# Patient Record
Sex: Female | Born: 1985 | Race: Black or African American | Hispanic: No | Marital: Single | Smoking: Never smoker
Health system: Southern US, Community
[De-identification: ages and names within clinical notes are randomized; demographics above are authoritative.]

## PROBLEM LIST (undated history)

## (undated) ENCOUNTER — Inpatient Hospital Stay (HOSPITAL_COMMUNITY): Payer: Self-pay

## (undated) ENCOUNTER — Inpatient Hospital Stay (HOSPITAL_COMMUNITY): Payer: Medicare Other

## (undated) DIAGNOSIS — R112 Nausea with vomiting, unspecified: Secondary | ICD-10-CM

## (undated) DIAGNOSIS — T8859XA Other complications of anesthesia, initial encounter: Secondary | ICD-10-CM

## (undated) DIAGNOSIS — R87629 Unspecified abnormal cytological findings in specimens from vagina: Secondary | ICD-10-CM

## (undated) DIAGNOSIS — Z8619 Personal history of other infectious and parasitic diseases: Secondary | ICD-10-CM

## (undated) DIAGNOSIS — R011 Cardiac murmur, unspecified: Secondary | ICD-10-CM

## (undated) DIAGNOSIS — Z9889 Other specified postprocedural states: Secondary | ICD-10-CM

## (undated) DIAGNOSIS — T4145XA Adverse effect of unspecified anesthetic, initial encounter: Secondary | ICD-10-CM

## (undated) DIAGNOSIS — E669 Obesity, unspecified: Secondary | ICD-10-CM

## (undated) DIAGNOSIS — F99 Mental disorder, not otherwise specified: Secondary | ICD-10-CM

## (undated) HISTORY — DX: Unspecified abnormal cytological findings in specimens from vagina: R87.629

## (undated) HISTORY — DX: Personal history of other infectious and parasitic diseases: Z86.19

## (undated) HISTORY — PX: COLPOSCOPY: SHX161

## (undated) HISTORY — DX: Obesity, unspecified: E66.9

---

## 2010-05-12 HISTORY — PX: BREAST SURGERY: SHX581

## 2011-06-03 ENCOUNTER — Inpatient Hospital Stay (HOSPITAL_COMMUNITY)
Admission: AD | Admit: 2011-06-03 | Discharge: 2011-06-03 | Disposition: A | Discharge status: Home / Self Care | Payer: Medicaid - Out of State | Source: Ambulatory Visit | Attending: Obstetrics & Gynecology | Admitting: Obstetrics & Gynecology

## 2011-06-03 ENCOUNTER — Encounter (HOSPITAL_COMMUNITY): Payer: Self-pay | Admitting: *Deleted

## 2011-06-03 DIAGNOSIS — N949 Unspecified condition associated with female genital organs and menstrual cycle: Secondary | ICD-10-CM

## 2011-06-03 DIAGNOSIS — N760 Acute vaginitis: Secondary | ICD-10-CM | POA: Insufficient documentation

## 2011-06-03 DIAGNOSIS — O2390 Unspecified genitourinary tract infection in pregnancy, unspecified trimester: Secondary | ICD-10-CM | POA: Insufficient documentation

## 2011-06-03 DIAGNOSIS — B9689 Other specified bacterial agents as the cause of diseases classified elsewhere: Secondary | ICD-10-CM | POA: Insufficient documentation

## 2011-06-03 DIAGNOSIS — R109 Unspecified abdominal pain: Secondary | ICD-10-CM | POA: Insufficient documentation

## 2011-06-03 DIAGNOSIS — A499 Bacterial infection, unspecified: Secondary | ICD-10-CM | POA: Insufficient documentation

## 2011-06-03 HISTORY — DX: Other specified postprocedural states: R11.2

## 2011-06-03 HISTORY — DX: Other specified postprocedural states: Z98.890

## 2011-06-03 HISTORY — DX: Other complications of anesthesia, initial encounter: T88.59XA

## 2011-06-03 HISTORY — DX: Mental disorder, not otherwise specified: F99

## 2011-06-03 HISTORY — DX: Adverse effect of unspecified anesthetic, initial encounter: T41.45XA

## 2011-06-03 LAB — URINALYSIS, ROUTINE W REFLEX MICROSCOPIC
Bilirubin Urine: NEGATIVE
Glucose, UA: NEGATIVE mg/dL
Ketones, ur: NEGATIVE mg/dL
Leukocytes, UA: NEGATIVE
Protein, ur: NEGATIVE mg/dL
pH: 6 (ref 5.0–8.0)

## 2011-06-03 LAB — WET PREP, GENITAL: Trich, Wet Prep: NONE SEEN

## 2011-06-03 MED ORDER — PROMETHAZINE HCL 25 MG PO TABS
25.0000 mg | ORAL_TABLET | Freq: Four times a day (QID) | ORAL | Status: DC | PRN
Start: 2011-06-03 — End: 2012-03-21

## 2011-06-03 MED ORDER — METRONIDAZOLE 500 MG PO TABS: 500.0000 mg | ORAL_TABLET | Freq: Two times a day (BID) | ORAL | Status: AC

## 2011-06-03 NOTE — Progress Notes (Signed)
Pt states, " I've had sharp pain in my abdomen for a couple of hours and it comes and goes."

## 2011-06-03 NOTE — ED Provider Notes (Signed)
History    G1 at 13 weeks presents to MAU with intermittent lower abdominal pain x2 hours and nausea.  She reports beginning her prenatal care in [place], [place], with a normal ultrasound on 05/09/11 confirming IUP.  She denies vaginal bleeding, LOF, vaginal itching/burning, dysuria.  She reports drinking very little water today and has dry mouth as well.    Chief Complaint  Patient presents with  . Abdominal Pain   HPI  OB History    Grav Para Term Preterm Abortions TAB SAB Ect Mult Living   1 0 0 0 0 0 0 0 0 0       Past Medical History  Diagnosis Date  . Mental disorder   . Schizoaffective disorder   . Complication of anesthesia   . PONV (postoperative nausea and vomiting)     Past Surgical History  Procedure Date  . Breast surgery 2012    reduction    Family History  Problem Relation Age of Onset  . Anesthesia problems Neg Hx     History  Substance Use Topics  . Smoking status: Never Smoker   . Smokeless tobacco: Not on file  . Alcohol Use: Yes    Allergies: No Known Allergies  Prescriptions prior to admission  Medication Sig Dispense Refill  . Prenatal Vit-Fe Fumarate-FA (PRENATAL MULTIVITAMIN) TABS Take 1 tablet by mouth daily.      . promethazine (PHENERGAN) 25 MG tablet Take 25 mg by mouth every 6 (six) hours as needed. Takes for nausea        Review of Systems  Constitutional: Negative.   HENT: Negative.   Eyes: Negative.   Respiratory: Negative.   Cardiovascular: Negative.   Gastrointestinal: Negative.   Genitourinary: Negative.   Musculoskeletal: Negative.   Skin: Negative.   Neurological: Negative.   Endo/Heme/Allergies: Negative.   Psychiatric/Behavioral:       Schizoaffective disorder previously treated, not currently on medications   Physical Exam   Blood pressure 121/66, pulse 76, temperature 98.9 F (37.2 C), temperature source Oral, resp. rate 20, height 5' 7.5" (1.715 m), weight 50.406 kg (111 lb 2 oz).  Physical Exam    Constitutional: She is oriented to person, place, and time. She appears well-developed and well-nourished.  Neck: Normal range of motion.  Respiratory: Effort normal.  GI: Soft. Bowel sounds are normal.  Musculoskeletal: Normal range of motion.  Neurological: She is alert and oriented to person, place, and time.  Skin: Skin is warm and dry.  Psychiatric: She has a normal mood and affect. Her behavior is normal. Judgment and thought content normal.   Results for orders placed during the hospital encounter of 06/03/11 (from the past 24 hour(s))  URINALYSIS, ROUTINE W REFLEX MICROSCOPIC     Status: Normal   Collection Time   06/03/11  7:15 PM      Component Value Range   Color, Urine YELLOW  YELLOW    APPearance CLEAR  CLEAR    Specific Gravity, Urine 1.020  1.005 - 1.030    pH 6.0  5.0 - 8.0    Glucose, UA NEGATIVE  NEGATIVE (mg/dL)   Hgb urine dipstick NEGATIVE  NEGATIVE    Bilirubin Urine NEGATIVE  NEGATIVE    Ketones, ur NEGATIVE  NEGATIVE (mg/dL)   Protein, ur NEGATIVE  NEGATIVE (mg/dL)   Urobilinogen, UA 0.2  0.0 - 1.0 (mg/dL)   Nitrite NEGATIVE  NEGATIVE    Leukocytes, UA NEGATIVE  NEGATIVE   WET PREP, GENITAL  Status: Abnormal   Collection Time   06/03/11  7:44 PM      Component Value Range   Yeast, Wet Prep NONE SEEN  NONE SEEN    Trich, Wet Prep NONE SEEN  NONE SEEN    Clue Cells, Wet Prep FEW (*) NONE SEEN    WBC, Wet Prep HPF POC FEW (*) NONE SEEN      MAU Course  Procedures U/A FHT Pelvic exam with wet prep and GC/Chlamydia   Assessment and Plan  A: Round ligament pain Bacterial vaginosis  P: D/C home with information on local providers for prenatal care. Prescriptions for phenergan and flagyl printed and given to pt.  LEFTWICH-KIRBY, Britt Theard 06/03/2011, 7:04 PM

## 2011-06-21 ENCOUNTER — Inpatient Hospital Stay (HOSPITAL_COMMUNITY)
Admission: AD | Admit: 2011-06-21 | Discharge: 2011-06-21 | Disposition: A | Discharge status: Home / Self Care | Payer: Medicare Other | Source: Ambulatory Visit | Attending: Obstetrics & Gynecology | Admitting: Obstetrics & Gynecology

## 2011-06-21 ENCOUNTER — Encounter (HOSPITAL_COMMUNITY): Payer: Self-pay | Admitting: *Deleted

## 2011-06-21 DIAGNOSIS — B3731 Acute candidiasis of vulva and vagina: Secondary | ICD-10-CM | POA: Insufficient documentation

## 2011-06-21 DIAGNOSIS — L293 Anogenital pruritus, unspecified: Secondary | ICD-10-CM | POA: Insufficient documentation

## 2011-06-21 DIAGNOSIS — B373 Candidiasis of vulva and vagina: Secondary | ICD-10-CM

## 2011-06-21 DIAGNOSIS — O2390 Unspecified genitourinary tract infection in pregnancy, unspecified trimester: Secondary | ICD-10-CM | POA: Insufficient documentation

## 2011-06-21 LAB — URINALYSIS, ROUTINE W REFLEX MICROSCOPIC
Bilirubin Urine: NEGATIVE
Leukocytes, UA: NEGATIVE
Nitrite: NEGATIVE
Specific Gravity, Urine: >1.03 — ABNORMAL HIGH (ref 1.005–1.030)
Urobilinogen, UA: 0.2 mg/dL (ref 0.0–1.0)
pH: 5.5 (ref 5.0–8.0)

## 2011-06-21 LAB — WET PREP, GENITAL: Clue Cells Wet Prep HPF POC: NONE SEEN

## 2011-06-21 MED ORDER — FLUCONAZOLE 150 MG PO TABS: 150.0000 mg | ORAL_TABLET | Freq: Once | ORAL | Status: AC

## 2011-06-21 NOTE — Progress Notes (Signed)
Finished med for BV from last visit.  About one day after, began having brown d/c with slight odor, burning vaginally when voiding.

## 2011-06-21 NOTE — ED Provider Notes (Signed)
History     No chief complaint on file.  HPI 26 y.o. G1P0000 at [redacted]w[redacted]d c/o vaginal itching, irritation, burning, discharge. Finished Flagyl for BV yesterday.    Past Medical History  Diagnosis Date  . Mental disorder   . Schizoaffective disorder   . Complication of anesthesia   . PONV (postoperative nausea and vomiting)     Past Surgical History  Procedure Date  . Breast surgery 2012    reduction    Family History  Problem Relation Age of Onset  . Anesthesia problems Neg Hx     History  Substance Use Topics  . Smoking status: Never Smoker   . Smokeless tobacco: Not on file  . Alcohol Use: Yes    Allergies: Not on File  Prescriptions prior to admission  Medication Sig Dispense Refill  . Prenatal Vit-Fe Fumarate-FA (PRENATAL MULTIVITAMIN) TABS Take 1 tablet by mouth daily.      . promethazine (PHENERGAN) 25 MG tablet Take 1 tablet (25 mg total) by mouth every 6 (six) hours as needed. Takes for nausea  30 tablet  0    Review of Systems  Constitutional: Negative.   Respiratory: Negative.   Cardiovascular: Negative.   Gastrointestinal: Negative for nausea, vomiting, abdominal pain, diarrhea and constipation.  Genitourinary: Positive for dysuria. Negative for urgency, frequency, hematuria and flank pain.       Negative for vaginal bleeding, + vaginal discharge  Musculoskeletal: Negative.   Neurological: Negative.   Psychiatric/Behavioral: Negative.    Physical Exam   Blood pressure 140/54, pulse 73, temperature 98.1 F (36.7 C), resp. rate 20, height 5\' 7"  (1.702 m), weight 215 lb (97.523 kg).  Physical Exam  Constitutional: She is oriented to person, place, and time. She appears well-developed and well-nourished. No distress.  HENT:  Head: Normocephalic and atraumatic.  Cardiovascular: Normal rate, regular rhythm and normal heart sounds.   Respiratory: Effort normal and breath sounds normal. No respiratory distress.  GI: Soft. Bowel sounds are normal. She  exhibits no distension and no mass. There is no tenderness. There is no rebound and no guarding.  Genitourinary: There is no rash or lesion on the right labia. There is no rash or lesion on the left labia. Cervix exhibits no discharge and no friability. No erythema, tenderness or bleeding around the vagina. Vaginal discharge (white, yeasty) found.  Neurological: She is alert and oriented to person, place, and time.  Skin: Skin is warm and dry.  Psychiatric: She has a normal mood and affect.    MAU Course  Procedures  Results for orders placed during the hospital encounter of 06/21/11 (from the past 24 hour(s))  URINALYSIS, ROUTINE W REFLEX MICROSCOPIC     Status: Abnormal   Collection Time   06/21/11  9:35 PM      Component Value Range   Color, Urine YELLOW  YELLOW    APPearance CLEAR  CLEAR    Specific Gravity, Urine >1.030 (*) 1.005 - 1.030    pH 5.5  5.0 - 8.0    Glucose, UA NEGATIVE  NEGATIVE (mg/dL)   Hgb urine dipstick NEGATIVE  NEGATIVE    Bilirubin Urine NEGATIVE  NEGATIVE    Ketones, ur NEGATIVE  NEGATIVE (mg/dL)   Protein, ur NEGATIVE  NEGATIVE (mg/dL)   Urobilinogen, UA 0.2  0.0 - 1.0 (mg/dL)   Nitrite NEGATIVE  NEGATIVE    Leukocytes, UA NEGATIVE  NEGATIVE   WET PREP, GENITAL     Status: Abnormal   Collection Time   06/21/11  10:43 PM      Component Value Range   Yeast Wet Prep HPF POC FEW (*) NONE SEEN    Trich, Wet Prep NONE SEEN  NONE SEEN    Clue Cells Wet Prep HPF POC NONE SEEN  NONE SEEN    WBC, Wet Prep HPF POC FEW (*) NONE SEEN      Assessment and Plan  26 y.o. G1P0000 at G1P0000  Vaginal candidiasis - rx diflucan F/U as scheduled for prenatal care  Dorothy Pham 06/21/2011, 9:50 PM

## 2011-07-14 ENCOUNTER — Other Ambulatory Visit: Payer: Self-pay

## 2011-07-14 ENCOUNTER — Encounter (INDEPENDENT_AMBULATORY_CARE_PROVIDER_SITE_OTHER): Payer: Medicaid Other | Admitting: Registered Nurse

## 2011-07-14 ENCOUNTER — Other Ambulatory Visit: Payer: Medicaid Other

## 2011-07-14 ENCOUNTER — Other Ambulatory Visit: Payer: Self-pay | Admitting: Registered Nurse

## 2011-07-14 DIAGNOSIS — Z331 Pregnant state, incidental: Secondary | ICD-10-CM

## 2011-07-14 DIAGNOSIS — Z0489 Encounter for examination and observation for other specified reasons: Secondary | ICD-10-CM

## 2011-07-14 DIAGNOSIS — O0930 Supervision of pregnancy with insufficient antenatal care, unspecified trimester: Secondary | ICD-10-CM

## 2011-07-14 DIAGNOSIS — Z3689 Encounter for other specified antenatal screening: Secondary | ICD-10-CM

## 2011-07-15 ENCOUNTER — Other Ambulatory Visit: Payer: Self-pay

## 2011-07-15 ENCOUNTER — Encounter (HOSPITAL_COMMUNITY): Payer: Self-pay

## 2011-07-15 ENCOUNTER — Ambulatory Visit (HOSPITAL_COMMUNITY)
Admission: RE | Admit: 2011-07-15 | Discharge: 2011-07-15 | Disposition: A | Discharge status: Home / Self Care | Payer: Medicare Other | Source: Ambulatory Visit | Attending: Registered Nurse | Admitting: Registered Nurse

## 2011-07-15 VITALS — BP 118/72 | HR 77 | Wt 213.0 lb

## 2011-07-15 DIAGNOSIS — Z363 Encounter for antenatal screening for malformations: Secondary | ICD-10-CM | POA: Insufficient documentation

## 2011-07-15 DIAGNOSIS — Z1389 Encounter for screening for other disorder: Secondary | ICD-10-CM | POA: Insufficient documentation

## 2011-07-15 DIAGNOSIS — Z0489 Encounter for examination and observation for other specified reasons: Secondary | ICD-10-CM

## 2011-07-15 DIAGNOSIS — O358XX0 Maternal care for other (suspected) fetal abnormality and damage, not applicable or unspecified: Secondary | ICD-10-CM | POA: Insufficient documentation

## 2011-07-15 NOTE — Progress Notes (Signed)
Dorothy Pham was seen for ultrasound appointment today.  Please see [facility] report for details.

## 2011-07-16 ENCOUNTER — Other Ambulatory Visit (HOSPITAL_COMMUNITY): Payer: Self-pay | Admitting: Maternal and Fetal Medicine

## 2011-07-18 ENCOUNTER — Other Ambulatory Visit: Payer: Self-pay

## 2011-07-24 ENCOUNTER — Encounter: Payer: Medicaid Other | Admitting: Registered Nurse

## 2011-08-22 ENCOUNTER — Ambulatory Visit (HOSPITAL_COMMUNITY)
Admission: RE | Admit: 2011-08-22 | Discharge: 2011-08-22 | Disposition: A | Discharge status: Home / Self Care | Payer: Medicaid Other | Source: Ambulatory Visit | Attending: Obstetrics and Gynecology | Admitting: Obstetrics and Gynecology

## 2011-08-22 DIAGNOSIS — IMO0002 Reserved for concepts with insufficient information to code with codable children: Secondary | ICD-10-CM

## 2011-08-22 NOTE — Progress Notes (Signed)
MATERNAL FETAL MEDICINE POSTOP VISIT  Patient Name: Dorothy Pham Medical Record Number:  [id number] Date of Birth: 12/07/1985 Date of Service: 08/22/2011  Chief Complaint S/P induction of labor  History of Present Illness Dorothy Pham is approximately one month s/p an IOL at 19 weeks of pregnancy due to multiple fetal anomalies.  She is doing very well.  She is no longer bleeding.  She has no GI or GU complaints.  Her mood has improved since leaving the hospital.  She feels as though Ambilify is helping.  She has no suicidal thoughts at this time.3  Review of Systems Pertinent items are noted in HPI.  Patient History OB History    Grav Para Term Preterm Abortions TAB SAB Ect Mult Living   1 0 0 0 0 0 0 0 0 0      # Outc Date GA Lbr Len/2nd Wgt Sex Del Anes PTL Lv   1 CUR               Past Medical History  Diagnosis Date  . Mental disorder   . Schizoaffective disorder   . Complication of anesthesia   . PONV (postoperative nausea and vomiting)   . Schizoaffective disorder     Past Surgical History  Procedure Date  . Breast surgery 2012    reduction    History   Social History  . Marital Status: Single    Spouse Name: N/A    Number of Children: N/A  . Years of Education: N/A   Social History Main Topics  . Smoking status: Never Smoker   . Smokeless tobacco: Not on file  . Alcohol Use: No  . Drug Use: No  . Sexually Active: Yes   Other Topics Concern  . Not on file   Social History Narrative  . No narrative on file    Family History  Problem Relation Age of Onset  . Anesthesia problems Neg Hx    In addition, the patient has no family history of mental retardation, birth defects, or genetic diseases.  Physical Examination Vitals:  BP 108/65, Pulse 55, Weight 213 lbs. General appearance - alert, well appearing, and in no distress Abdomen - soft, nontender, nondistended, no masses or organomegaly Pelvic - normal external genitalia, vulva, vagina,  cervix, uterus and adnexa Extremities - peripheral pulses normal, no pedal edema, no clubbing or cyanosis  Assessment and Recommendations 1.  s/p induction of labor for multiple fetal anomalies.  She is recovering very well.  She no longer needs our services at the [facility].  I provided Dorothy Pham with an additional month's supply of Ambilify.  The patient's mother will attempt to have the patient seen by her own therapist for ongoing care of her schizoaffective disorder.  I also provider the patient with the contact info for [facility] in [place].  Rema Fendt, MD

## 2011-08-27 ENCOUNTER — Telehealth: Payer: Self-pay | Admitting: Registered Nurse

## 2011-08-27 NOTE — Telephone Encounter (Signed)
Routed to triage 

## 2011-08-28 NOTE — Telephone Encounter (Signed)
Routed to triage 

## 2011-08-29 NOTE — Telephone Encounter (Signed)
PT CALLED NEEDING A RF OF HER PNV, THE KIND SHE WAS TAKING IS NO LONGER COVERED BY HER INSURANCE.  TC TO [facility], SPOKE WITH NICK TO RF PNV COVERED BY INSURANCE, WITH RF FOR 1 YEAR, VOICES UNDERSTANDING.

## 2011-09-01 ENCOUNTER — Telehealth (HOSPITAL_COMMUNITY): Payer: Self-pay | Admitting: MS"

## 2011-09-01 NOTE — Telephone Encounter (Signed)
Left message for [name] calling with some additional information from Dr. Baltazar Apo office. Asked for patient to return call.

## 2011-09-01 NOTE — Telephone Encounter (Signed)
Talked with Dorothy Pham regarding chromosome results on products of conception. 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 We discussed that this likely provides the explanation for the features in the previous pregnancy. It is unclear if this was inherited from a parent or occurred for the first time in the pregnancy. I stated that we would like to meet with Dorothy Pham in person to review these results in more detail. Additional follow-up typically involves performing chromosome microarray analysis on a blood sample from both Dorothy Pham and the father of the pregnancy to assess recurrence risk for a future pregnancy. She stated that the father of the pregnancy lives out of town and would not be available at this time. Dorothy Pham is planning to come for genetic counseling on 09/05/11 at 10:00. She was encouraged to call with additional questions prior to this time. Quinn Plowman, MS Certified Genetic Counselor 10:20 AM

## 2011-09-05 ENCOUNTER — Ambulatory Visit (HOSPITAL_COMMUNITY)
Admission: RE | Admit: 2011-09-05 | Discharge: 2011-09-05 | Disposition: A | Discharge status: Home / Self Care | Payer: Medicare Other | Source: Ambulatory Visit | Attending: Obstetrics and Gynecology | Admitting: Obstetrics and Gynecology

## 2011-09-05 DIAGNOSIS — Z1231 Encounter for screening mammogram for malignant neoplasm of breast: Secondary | ICD-10-CM | POA: Insufficient documentation

## 2011-09-17 LAB — MICROARRAY TO WFUBMC

## 2011-09-22 NOTE — Progress Notes (Signed)
Genetic Counseling  Preconception Note  Appointment Date:  09/05/2011 Referred By: Purcell Nails, MD Date of Birth:  [birth year]   Attending: Rema Fendt, MD   [name] was seen for preconception genetic counseling because of the presence of a microdeletion on chromosome microarray analysis performed on products of conception from her previous pregnancy. The patient was accompanied by her mother today.   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  Chromosome microarray analysis performed on products of conception from 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 This means that missing genetic material was detected on one copy of chromosome one from the long are of the chromosome (denoted as "q") at band level 21.1. The deleted genetic material was detected at region 1q21.1 to 1q21.2, which is approximately 1.8 Mb in size. This deletion contains at least 14 genes.    We reviewed that chromosomes are the inherited structures that contain our instructions for development.  Genes are the smaller units of genetic information contained within our chromosomes.  Each cell of the body usually has 46 chromosomes, matched up into 23 pairs.  The last pair is called the sex chromosomes and determines gender.  Typically, a female has an X and a Y chromosome, while a female has two X chromosomes.  Any change in the number or structure of chromosomes can increase the risk of problems in the physical and mental development of a pregnancy.  The 1q21.1 microdeletion has been reported to have various clinical findings including microcephaly, mild intellectual disability, mildly dysmorphic facial features, eye abnormalities, and less  commonly cardiac defects, genitourinary anomalies, skeletal malformations, and seizures. Psychiatric and behavioral abnormalities have also been reported with variable expressivity including autism spectrum disorders, attention deficit hyperactivity disorder, sleep disturbances, and schizophrenia. We discussed that we are not able to definitively determine if the deletion was causative for the anomalies present in [name]' previous pregnancy. However, it is possible that this was the underlying etiology, given the association with skeletal anomalies and the 1q21.1 deletion.   Currently little information is available regarding penetrance of 1q21.1 microdeletion. However, reduced penetrance is suspected given that it has been observed in individuals with normal phenotype or mild manifestations. It is reported that 18%-50% of 1q21.1 deletions occur de novo and are not inherited from a parent. For individuals who have the 1q21.1 deletion, it follows autosomal dominant inheritance, meaning each offspring of an individual with the deletion has a 1 in 2 (50%) chance to inherit the deletion. Thus, if one parents carries the 1q21.1 deletion, recurrence risk for a future pregnancy for that parent would be 50%. If neither parent carries the microdeletion, recurrence risk for a future pregnancy is low, likely <1%, but is greater than the general population risk given that possibility of germline mosaicism (some cells containing the deletion and some cells without the deletion in the egg or sperm cells) cannot be eliminated. We discussed the option of parental blood analysis from [name] and the father of the pregnancy to assess for the presence of the 1q21.1 deletion in order to assess recurrence risk for future pregnancies.   Both family histories were reviewed and found to be contributory for schizoaffective disorder in the patient. Additionally, the patient reported a paternal great-aunt with schizophrenia.  Schizophrenia can be seen as part of an underlying genetic condition. A cause for schizophrenia in the majority of cases has not yet been identified, although it is believed that multiple genes and environmental factors may contribute (multifactorial inheritance).  The general population has a 1% risk to develop schizophrenia in their lifetime.  Studies indicate that a person's chance to develop schizophrenia is approximately 13% when they have one affected parent. The patient was not familiar with the father of the baby's family history.  We, therefore, cannot comment on how his history might contribute to the overall chance for a pregnancy to have a birth defect. Without further information regarding the provided family history, an accurate genetic risk cannot be calculated. Further genetic counseling is warranted if more information is obtained.  At the time of today's visit, [name] elected to proceed with blood chromosome microarray analysis to assess for the presence of 1q21.1 microdeletion. The father of the previous pregnancy was not available for parental studies at the time of today's visit given that he resides in [place]. [place]. We will contact the patient once these results are available.   I counseled [name] regarding the above risks and available options.  The approximate face-to-face time with the genetic counselor was 35 minutes.  Dorothy Plowman, MS,  Certified Genetic Counselor 09/22/2011   Addendum (09/24/2011):   Maternal FISH analysis performed on [name] indicated that she did not carry a loss involving the 1q21.1 region. We discussed this result with [name] via telephone. This means that her relatives are not at increased risk to carry this microdeletion. Paternal FISH analysis is available to her partner to assess whether or not the 1q21.1 microdeletion in the previous pregnancy was inherited from him or was de novo in the previous pregnancy which would  better determine recurrence risk for a future pregnancy. If the father of the pregnancy also has the microdeletion, then recurrence risk for each of his offspring is 50%. If he does have the 1q21.1 deletion, then recurrence risk for a future pregnancy for the couple would likely be low, but the possibility of gonadal mosaicism could not be ruled out. [name] stated that she was unsure if the father of the pregnancy would be able to have his blood drawn. We discussed that if he was interested in testing, it would be most helpful for the same laboratory to also perform the study, [facility]. Dorothy Pham stated that he lives in [place]. [place]. We discussed that we could work to facilitate shipment of his blood sample to the laboratory, if desired. The patient was encouraged to contact us, should they elect to pursue this testing.

## 2011-09-23 ENCOUNTER — Encounter: Payer: Self-pay | Admitting: Obstetrics and Gynecology

## 2011-09-23 ENCOUNTER — Ambulatory Visit (INDEPENDENT_AMBULATORY_CARE_PROVIDER_SITE_OTHER): Payer: Medicaid Other | Admitting: Obstetrics and Gynecology

## 2011-09-23 VITALS — BP 118/74 | Resp 16 | Wt 217.0 lb

## 2011-09-23 DIAGNOSIS — E669 Obesity, unspecified: Secondary | ICD-10-CM

## 2011-09-23 DIAGNOSIS — O418X90 Other specified disorders of amniotic fluid and membranes, unspecified trimester, not applicable or unspecified: Secondary | ICD-10-CM

## 2011-09-23 DIAGNOSIS — Z332 Encounter for elective termination of pregnancy: Secondary | ICD-10-CM

## 2011-09-23 DIAGNOSIS — N760 Acute vaginitis: Secondary | ICD-10-CM

## 2011-09-23 DIAGNOSIS — Q798 Other congenital malformations of musculoskeletal system: Secondary | ICD-10-CM

## 2011-09-23 DIAGNOSIS — R350 Frequency of micturition: Secondary | ICD-10-CM

## 2011-09-23 LAB — POCT URINALYSIS DIPSTICK
Blood, UA: NEGATIVE
Glucose, UA: NEGATIVE
Nitrite, UA: NEGATIVE
Spec Grav, UA: 1.025
Urobilinogen, UA: NEGATIVE
pH, UA: 5

## 2011-09-23 NOTE — Progress Notes (Signed)
[name] is a 26 y.o. year old female,G1P0000, who presents for a problem visit. The patient had a second trimester pregnancy termination because of amniotic band syndrome in March of 2013. She took a morning after pill because she was concerned that she may get pregnant.  Subjective:  The patient complains of urinary frequency and dysuria.  She complains of vaginal irritation.  She has constipation issues.  She complains that there is something "moving" in her upper abdomen.  Objective:  BP 118/74  Resp 16  Wt 217 lb (98.431 kg)  LMP 08/28/2011  Breastfeeding? No   General: alert and cooperative Resp: clear to auscultation bilaterally Cardio: regular rate and rhythm, S1, S2 normal, no murmur, click, rub or gallop GI: soft, non-tender; bowel sounds normal; no masses,  no organomegaly  External genitalia: normal general appearance Vaginal: normal without tenderness, induration or masses Cervix: normal appearance Adnexa: normal bimanual exam Uterus: normal size shape and consistency  Wet prep: Positive with, pH 5.5, clue cells  Urine pregnancy test: Negative  Assessment:  Bacterial vaginosis Probable constipation and GI distress  Plan:  GC and Chlamydia sent Metronidazole 500 mg twice a day for 7 days Call if symptoms continue See her primary physician for management of systemic issues.  Return to office prn if symptoms worsen or fail to improve.  Leonard Schwartz M.D.  09/24/2011 10:03 AM

## 2011-09-23 NOTE — Patient Instructions (Signed)
Vaginitis Vaginitis is an infection. It causes soreness, swelling, and redness (inflammation) of the vagina. Many of these infections are sexually transmitted diseases (STDs). Having unprotected sex can cause further problems and complications such as:  Chronic pelvic pain.   Infertility.   Unwanted pregnancy.   Abortion.   Tubal pregnancy.   Infection passed on to the newborn.   Cancer.  CAUSES   Monilia. This is a yeast or fungus infection, not an STD.   Bacterial vaginosis. The normal balance of bacteria in the vagina is disrupted and is replaced by an overgrowth of certain bacteria.   Gonorrhea, chlamydia. These are bacterial infections that are STDs.   Vaginal sponges, diaphragms, and intrauterine devices.   Trichomoniasis. This is a STD infection caused by a parasite.   Viruses like herpes and human papillomavirus. Both are STDs.   Pregnancy.   Immunosuppression. This occurs with certain conditions such as HIV infection or cancer.   Using bubble bath.   Taking certain antibiotic medicines.   Sporadic recurrence can occur if you become sick.   Diabetes.   Steroids.   Allergic reaction. If you have an allergy to:   Douches.   Soaps.   Spermicides.   Condoms.   Scented tampons or vaginal sprays.  SYMPTOMS   Abnormal vaginal discharge.   Itching of the vagina.   Pain in the vagina.   Swelling of the vagina.  In some cases, there are no symptoms. TREATMENT  Treatment will vary depending on the type of infection.  Bacteria or trichomonas are usually treated with oral antibiotics and sometimes vaginal cream or suppositories.   Monilia vaginitis is usually treated with vaginal creams, suppositories, or oral antifungal pills.   Viral vaginitis has no cure. However, the symptoms of herpes (a viral vaginitis) can be treated to relieve the discomfort. Human papillomavirus has no symptoms. However, there are treatments for the diseases caused by human  papillomavirus.   With allergic vaginitis, you need to stop using the product that is causing the problem. Vaginal creams can be used to treat the symptoms.   When treating an STD, the sex partner should also be treated.  HOME CARE INSTRUCTIONS   Take all the medicines as directed by your caregiver.   Do not use scented tampons, soaps, or vaginal sprays.   Do not douche.   Tell your sex partner if you have a vaginal infection or an STD.   Do not have sexual intercourse until you have treated the vaginitis.   Practice safe sex by using condoms.  SEEK MEDICAL CARE IF:   You have abdominal pain.   Your symptoms get worse during treatment.  Document Released: 02/23/2007 Document Revised: 04/17/2011 Document Reviewed: 10/19/2008 [employer] Patient Information 2012 [employer]. 

## 2011-09-24 ENCOUNTER — Telehealth: Payer: Self-pay | Admitting: Obstetrics and Gynecology

## 2011-09-24 DIAGNOSIS — E669 Obesity, unspecified: Secondary | ICD-10-CM | POA: Insufficient documentation

## 2011-09-24 DIAGNOSIS — Q798 Other congenital malformations of musculoskeletal system: Secondary | ICD-10-CM | POA: Insufficient documentation

## 2011-09-24 DIAGNOSIS — Z332 Encounter for elective termination of pregnancy: Secondary | ICD-10-CM | POA: Insufficient documentation

## 2011-09-24 LAB — GC/CHLAMYDIA PROBE AMP, GENITAL
Chlamydia, DNA Probe: NEGATIVE
GC Probe Amp, Genital: NEGATIVE

## 2011-09-24 LAB — URINE CULTURE

## 2011-09-24 MED ORDER — METRONIDAZOLE 500 MG PO TABS
500.0000 mg | ORAL_TABLET | Freq: Two times a day (BID) | ORAL | Status: AC
Start: 2011-09-24 — End: 2011-10-04

## 2011-09-24 NOTE — Telephone Encounter (Signed)
Returned pt's call regarding question if a second RX was called in for UTI. Pt was advised that urine culture was not back as yet. The only RX called in was Metronidazole. Pt said that she would be picking that RX up today. The urine dipstick results were explained to the pt and she was advised that in further RX would be based on results of urine culture results per Dr. Stefano Gaul. Mathis Bud

## 2011-09-24 NOTE — Telephone Encounter (Signed)
Dorothy Pham/was seen by AV[facility] yest

## 2011-10-13 ENCOUNTER — Encounter: Payer: Medicaid Other | Admitting: Registered Nurse

## 2011-10-30 ENCOUNTER — Telehealth: Payer: Self-pay | Admitting: Obstetrics and Gynecology

## 2011-10-30 NOTE — Telephone Encounter (Signed)
Triage/cht received 

## 2011-10-30 NOTE — Telephone Encounter (Signed)
Spoke with pt informed all test wnl pt voice understanding

## 2011-12-22 ENCOUNTER — Encounter: Payer: Self-pay | Admitting: Obstetrics and Gynecology

## 2011-12-22 ENCOUNTER — Ambulatory Visit (INDEPENDENT_AMBULATORY_CARE_PROVIDER_SITE_OTHER): Payer: Medicare Other | Admitting: Obstetrics and Gynecology

## 2011-12-22 VITALS — BP 100/60 | Temp 98.1°F | Wt 209.0 lb

## 2011-12-22 DIAGNOSIS — Z719 Counseling, unspecified: Secondary | ICD-10-CM

## 2011-12-22 DIAGNOSIS — R109 Unspecified abdominal pain: Secondary | ICD-10-CM

## 2011-12-22 DIAGNOSIS — N898 Other specified noninflammatory disorders of vagina: Secondary | ICD-10-CM

## 2011-12-22 LAB — POCT WET PREP (WET MOUNT)
Clue Cells Wet Prep Whiff POC: NEGATIVE
pH: 4

## 2011-12-22 NOTE — Progress Notes (Signed)
Color: brown Odor: yes Itching:yes Thin:yes Thick:no Fever:no Dyspareunia:no Hx PID:no HX STD:no Pelvic Pain:no Desires Gc/CT:yes Desires HIV,RPR,HbsAG:no Pt states she was recently on an antibiotic for gum infection. Pt also c/o occasional abdominal pain.   HISTORY OF PRESENT ILLNESS  [name] is a 26 y.o. year old female,G1P0000, who presents for a problem visit. She was recently treated with antibiotics for a gum infection.  Subjective:  She complains of vaginal discharge without itching.  She also complains of vague abdominal pain.  She has questionable GI symptoms.  Objective:  BP 100/60  Temp 98.1 F (36.7 C) (Oral)  Wt 209 lb (94.802 kg)  LMP 12/15/2011   General: no distress Resp: clear to auscultation bilaterally Cardio: regular rate and rhythm, S1, S2 normal, no murmur, click, rub or gallop GI: soft and nontender  External genitalia: normal general appearance Vaginal: nontender, no masses Cervix: normal appearance Adnexa: normal bimanual exam Uterus: upper limits normal size  Wet prep: PH 4.0.  Whiff negative.  Questionable clue cells.  Assessment:  Vaginal discharge Abdominal pain  Plan:  GC and Chlamydia Observed pain  Metronidazole 500 mg twice each day for 7 days Mycolog-II cream  Return to office prn if symptoms worsen or fail to improve.   Leonard Schwartz M.D.  12/23/2011 8:34 AM

## 2011-12-23 MED ORDER — FLUCONAZOLE 150 MG PO TABS: 150.0000 mg | ORAL_TABLET | Freq: Every day | ORAL | Status: AC

## 2011-12-23 MED ORDER — METRONIDAZOLE 500 MG PO TABS: 500.0000 mg | ORAL_TABLET | Freq: Two times a day (BID) | ORAL | Status: AC

## 2011-12-29 ENCOUNTER — Other Ambulatory Visit: Payer: Self-pay | Admitting: Family Medicine

## 2011-12-29 DIAGNOSIS — R1013 Epigastric pain: Secondary | ICD-10-CM

## 2011-12-30 ENCOUNTER — Ambulatory Visit
Admission: RE | Admit: 2011-12-30 | Discharge: 2011-12-30 | Disposition: A | Discharge status: Home / Self Care | Payer: Medicare Other | Source: Ambulatory Visit | Attending: Family Medicine | Admitting: Family Medicine

## 2011-12-30 DIAGNOSIS — R1013 Epigastric pain: Secondary | ICD-10-CM

## 2012-01-05 ENCOUNTER — Telehealth: Payer: Self-pay | Admitting: Obstetrics and Gynecology

## 2012-01-05 NOTE — Telephone Encounter (Signed)
TC TO PT REGARDING MESSAGE. PT WANTED THE TEST RESULTS TO HER LABS SHE HAD DONE AND TOLD PT THAT GC/CT WAS NEG. ALSO, PT STATED THAT SHE IS STILL IRRITATED  IN THE VAGINAL AREA AND WANT TO KNOW CAN AVS CALL IN SOMETHING ELSE.INFORMED PT THAT I WILL SEND AVS A NOTE AND SEE WHAT HIS SUGGESTION IS. PT VOICED UNDERSTANDING.

## 2012-01-05 NOTE — Telephone Encounter (Signed)
DR. AVS,    YOU SAW THIS PT ON 12/22/11 FOR A VAGINAL DISCHARGE AND PT STATES THAT SHE IS STILL HAVING SOME IRRITATION AND A LITTLE BURNING. I SEE WHERE YOU GAVE PT METRONIDAZOLE PILL AND UNDER YOUR NOTE YOU STATE MYCOLOG CREAM BUT, I SEE NO ORDER FOR THE CREAM. PT WANT TO KNOW CAN YOU GIVE HER SOMETHING ELSE FOR THE IRRITATION.

## 2012-01-07 ENCOUNTER — Telehealth: Payer: Self-pay | Admitting: Obstetrics and Gynecology

## 2012-01-07 ENCOUNTER — Encounter: Payer: Medicare Other | Admitting: Obstetrics and Gynecology

## 2012-01-07 ENCOUNTER — Telehealth: Payer: Self-pay

## 2012-01-07 NOTE — Telephone Encounter (Signed)
Pt called and stated she needed a early appt for tomorrow. Pt had an appt scheduled w/ EP for 3:00pm Pt stated she cancelled her 3pm appt. But the appt was still showing on EP schedule. Pt states she was told there were no avav appts tomorrow morning. And still asked if she can come early. I made the pt aware she cannot come to the office if there aren't any openings. Pt asked if I can find her an appt. I made pt aware I was on the floor and she can speak to scheduling for the next ava appt. Pam G is in triage today and assisted me w/ this call  and informed the pt we will try to find her appt.  [facility] CMA

## 2012-01-07 NOTE — Telephone Encounter (Signed)
Pt called regarding vaginal irritation. Pt c/o burning and discharge w/ a urine foul odor. No pelvic pain is associated w/ symptoms Discharge is white/thick appt made w/EP for same day slot @ 3pm Pt was called to confirm appt pt did not answer. Pt needs to call and confirm or R/S to be evaluated   LC

## 2012-01-08 ENCOUNTER — Encounter: Payer: Self-pay | Admitting: Obstetrics and Gynecology

## 2012-01-08 ENCOUNTER — Ambulatory Visit (INDEPENDENT_AMBULATORY_CARE_PROVIDER_SITE_OTHER): Payer: Medicare Other | Admitting: Obstetrics and Gynecology

## 2012-01-08 VITALS — BP 102/62 | Ht 67.0 in | Wt 220.0 lb

## 2012-01-08 DIAGNOSIS — N760 Acute vaginitis: Secondary | ICD-10-CM

## 2012-01-08 DIAGNOSIS — N762 Acute vulvitis: Secondary | ICD-10-CM

## 2012-01-08 DIAGNOSIS — Z719 Counseling, unspecified: Secondary | ICD-10-CM

## 2012-01-08 DIAGNOSIS — N898 Other specified noninflammatory disorders of vagina: Secondary | ICD-10-CM

## 2012-01-08 LAB — POCT WET PREP (WET MOUNT)
Bacteria Wet Prep HPF POC: NEGATIVE
Clue Cells Wet Prep Whiff POC: NEGATIVE
WBC, Wet Prep HPF POC: NEGATIVE
pH: 4.5

## 2012-01-08 MED ORDER — CLOTRIMAZOLE-BETAMETHASONE 1-0.05 % EX CREA: TOPICAL_CREAM | Freq: Every day | CUTANEOUS | Status: DC

## 2012-01-08 NOTE — Progress Notes (Signed)
C/o persistent labial irritation.  No urinary sxs.  Reports neg GC/CT a couple of weeks ago s/p flagyl and mycolog cream  Filed Vitals:   01/08/12 0928  BP: 102/62   ROS: noncontributory  Pelvic exam:  VULVA: normal appearing vulva with no masses, tenderness or lesions,  VAGINA: normal appearing vagina with normal color and discharge, no lesions, minimal d/c in vagina CERVIX: normal appearing cervix without discharge or lesions,  UTERUS: uterus is normal size, shape, consistency and nontender,  ADNEXA: normal adnexa in size, nontender and no masses.  Results for orders placed in visit on 01/08/12  POCT WET PREP (WET MOUNT)      Component Value Range   Source Wet Prep POC       WBC, Wet Prep HPF POC neg     Bacteria Wet Prep HPF POC neg     BACTERIA WET PREP MORPHOLOGY POC       Clue Cells Wet Prep HPF POC None     CLUE CELLS WET PREP WHIFF POC Negative Whiff     Yeast Wet Prep HPF POC None     KOH Wet Prep POC       Trichomonas Wet Prep HPF POC neg     pH 4.5       A/P Wet prep - neg Vulvitis - Lotrisone cream

## 2012-01-08 NOTE — Telephone Encounter (Signed)
Okay to give patient Mycolog-II cream.  Dr. Stefano Gaul

## 2012-02-10 ENCOUNTER — Encounter: Payer: Self-pay | Admitting: Obstetrics and Gynecology

## 2012-02-10 ENCOUNTER — Ambulatory Visit (INDEPENDENT_AMBULATORY_CARE_PROVIDER_SITE_OTHER): Payer: Medicaid Other | Admitting: Obstetrics and Gynecology

## 2012-02-10 VITALS — BP 110/60 | HR 70 | Resp 16 | Ht 67.0 in | Wt 217.0 lb

## 2012-02-10 DIAGNOSIS — Z1321 Encounter for screening for nutritional disorder: Secondary | ICD-10-CM

## 2012-02-10 DIAGNOSIS — Z139 Encounter for screening, unspecified: Secondary | ICD-10-CM

## 2012-02-10 DIAGNOSIS — R5381 Other malaise: Secondary | ICD-10-CM

## 2012-02-10 DIAGNOSIS — R5383 Other fatigue: Secondary | ICD-10-CM

## 2012-02-10 DIAGNOSIS — E559 Vitamin D deficiency, unspecified: Secondary | ICD-10-CM

## 2012-02-10 DIAGNOSIS — Z1329 Encounter for screening for other suspected endocrine disorder: Secondary | ICD-10-CM

## 2012-02-10 DIAGNOSIS — Z130 Encounter for screening for diseases of the blood and blood-forming organs and certain disorders involving the immune mechanism: Secondary | ICD-10-CM

## 2012-02-10 DIAGNOSIS — IMO0002 Reserved for concepts with insufficient information to code with codable children: Secondary | ICD-10-CM

## 2012-02-10 DIAGNOSIS — N898 Other specified noninflammatory disorders of vagina: Secondary | ICD-10-CM

## 2012-02-10 DIAGNOSIS — Z124 Encounter for screening for malignant neoplasm of cervix: Secondary | ICD-10-CM

## 2012-02-10 LAB — POCT WET PREP (WET MOUNT)
Clue Cells Wet Prep Whiff POC: NEGATIVE
pH: 5

## 2012-02-10 MED ORDER — TINIDAZOLE 500 MG PO TABS: 2.0000 g | ORAL_TABLET | Freq: Every day | ORAL | Status: DC

## 2012-02-10 NOTE — Progress Notes (Signed)
Contraception none Last pap 2012 WNL Last Mammo None Last Colonoscopy None Last Dexa Scan None Primary MD Renaye Rakers Abuse at Home None  C/o recurrent BV.  Had full STD screen with Dr. Parke Simmers a few wks ago and pt said HSV testing was done at that time and it was neg (both cx and bloodwork)  Filed Vitals:   02/10/12 1150  BP: 110/60  Pulse: 70  Resp: 16   ROS: noncontributory  Physical Examination: General appearance - alert, well appearing, and in no distress Neck - supple, no significant adenopathy Chest - clear to auscultation, no wheezes, rales or rhonchi, symmetric air entry Heart - normal rate and regular rhythm Abdomen - soft, nontender, nondistended, no masses or organomegaly Breasts - breasts appear normal, no suspicious masses, no skin or nipple changes or axillary nodes Pelvic - normal external genitalia, vulva, vagina, cervix, uterus and adnexa Back exam - no CVAT Extremities - no edema, redness or tenderness in the calves or thighs  A/P Pap today Wet prep - mild BV - [place] Check vit D Also rec rephresh

## 2012-02-12 ENCOUNTER — Telehealth: Payer: Self-pay

## 2012-02-12 DIAGNOSIS — E559 Vitamin D deficiency, unspecified: Secondary | ICD-10-CM

## 2012-02-12 LAB — PAP IG W/ RFLX HPV ASCU

## 2012-02-12 NOTE — Telephone Encounter (Signed)
Pt was called and given Vit-d protocol. Rx was called in for Vit-d softgels 50,000 units 1 cap 1 x wk for 12 wks #20 with 0 rf. Future lab ordered. Pt was also advised of pap results, and need of Colpo. HPV pamphlet will be mailed to pt. Colpo will be scheduled on a Friday per pt request. Pt will be notified of time and date of Colpo. Mathis Bud

## 2012-02-19 ENCOUNTER — Telehealth: Payer: Self-pay

## 2012-02-19 NOTE — Telephone Encounter (Signed)
Pt was called and given ov for Colpo. Scheduled for 03/19/2012 @2 :00pm. Mathis Bud

## 2012-03-01 ENCOUNTER — Encounter: Payer: Medicare Other | Admitting: Obstetrics and Gynecology

## 2012-03-17 ENCOUNTER — Telehealth: Payer: Self-pay | Admitting: Obstetrics and Gynecology

## 2012-03-17 NOTE — Telephone Encounter (Signed)
Spoke with pt rgd msg pt wants to r/s colpo due to cycle being on advised pt AR assistant will call her back with appt date and time pt voice understanding

## 2012-03-17 NOTE — Telephone Encounter (Signed)
Lm on vm tcb rgd msg 

## 2012-03-18 ENCOUNTER — Telehealth: Payer: Self-pay

## 2012-03-18 NOTE — Telephone Encounter (Signed)
LM for pt to cb re: r/s her colpo. The day I have in mind is 04/21/2012 @ 10:45. Melody Comas A

## 2012-03-19 ENCOUNTER — Encounter: Payer: Medicare Other | Admitting: Obstetrics and Gynecology

## 2012-03-21 ENCOUNTER — Emergency Department (HOSPITAL_COMMUNITY)
Admission: EM | Admit: 2012-03-21 | Discharge: 2012-03-21 | Disposition: A | Discharge status: Home / Self Care | Payer: Medicare Other | Attending: Emergency Medicine | Admitting: Emergency Medicine

## 2012-03-21 ENCOUNTER — Encounter (HOSPITAL_COMMUNITY): Payer: Self-pay | Admitting: Emergency Medicine

## 2012-03-21 DIAGNOSIS — Z79899 Other long term (current) drug therapy: Secondary | ICD-10-CM | POA: Insufficient documentation

## 2012-03-21 DIAGNOSIS — F259 Schizoaffective disorder, unspecified: Secondary | ICD-10-CM | POA: Insufficient documentation

## 2012-03-21 DIAGNOSIS — J029 Acute pharyngitis, unspecified: Secondary | ICD-10-CM

## 2012-03-21 DIAGNOSIS — R1310 Dysphagia, unspecified: Secondary | ICD-10-CM | POA: Insufficient documentation

## 2012-03-21 LAB — RAPID STREP SCREEN (MED CTR MEBANE ONLY): Streptococcus, Group A Screen (Direct): NEGATIVE

## 2012-03-21 MED ORDER — PENICILLIN G BENZATHINE 1200000 UNIT/2ML IM SUSP
1.2000 10*6.[IU] | Freq: Once | INTRAMUSCULAR | Status: AC
  Administered 2012-03-21: 1.2 10*6.[IU] via INTRAMUSCULAR
  Filled 2012-03-21: qty 2

## 2012-03-21 MED ORDER — DEXAMETHASONE SODIUM PHOSPHATE 10 MG/ML IJ SOLN
10.0000 mg | Freq: Once | INTRAMUSCULAR | Status: AC
  Administered 2012-03-21: 10 mg via INTRAMUSCULAR
  Filled 2012-03-21: qty 1

## 2012-03-21 NOTE — ED Provider Notes (Signed)
Medical screening examination/treatment/procedure(s) were performed by non-physician practitioner and as supervising physician I was immediately available for consultation/collaboration.   Gwyneth Sprout, MD 03/21/12 [phone]

## 2012-03-21 NOTE — ED Provider Notes (Signed)
History     CSN: [id number]  Arrival date & time 03/21/12  1454   First MD Initiated Contact with Patient 03/21/12 1514      Chief Complaint  Patient presents with  . Sore Throat    (Consider location/radiation/quality/duration/timing/severity/associated sxs/prior treatment) Patient is a 26 y.o. female presenting with pharyngitis. The history is provided by the patient.  Sore Throat This is a new problem. Episode onset: 2-3 days. The problem occurs constantly. The problem has been gradually worsening. Associated symptoms include a sore throat. Pertinent negatives include no abdominal pain, anorexia, arthralgias, change in bowel habit, chest pain, chills, congestion, coughing, diaphoresis, fatigue, fever, headaches, joint swelling, myalgias, nausea, neck pain, numbness, rash, swollen glands, urinary symptoms, vertigo, visual change, vomiting or weakness. The symptoms are aggravated by swallowing. She has tried acetaminophen for the symptoms. The treatment provided no relief.    Past Medical History  Diagnosis Date  . Mental disorder   . Schizoaffective disorder   . Complication of anesthesia   . PONV (postoperative nausea and vomiting)   . Schizoaffective disorder     Past Surgical History  Procedure Date  . Breast surgery 2012    reduction    Family History  Problem Relation Age of Onset  . Anesthesia problems Neg Hx   . Diabetes Father   . Arthritis Father   . Arthritis Mother     History  Substance Use Topics  . Smoking status: Never Smoker   . Smokeless tobacco: Never Used  . Alcohol Use: No    OB History    Grav Para Term Preterm Abortions TAB SAB Ect Mult Living   1 0 0 0 0 0 0 0 0 0       Review of Systems  Constitutional: Negative for fever, chills, diaphoresis and fatigue.  HENT: Positive for sore throat and trouble swallowing. Negative for congestion, drooling, mouth sores, neck pain and voice change.   Respiratory: Negative for cough.     Cardiovascular: Negative for chest pain.  Gastrointestinal: Negative for nausea, vomiting, abdominal pain, anorexia and change in bowel habit.  Musculoskeletal: Negative for myalgias, joint swelling and arthralgias.  Skin: Negative for rash.  Neurological: Negative for vertigo, weakness, numbness and headaches.  All other systems reviewed and are negative.    Allergies  Review of patient's allergies indicates no known allergies.  Home Medications   Current Outpatient Rx  Name  Route  Sig  Dispense  Refill  . ARIPIPRAZOLE 20 MG PO TABS   Oral   Take 20 mg by mouth daily.          Marland Kitchen VITAMIN D (ERGOCALCIFEROL) 50000 UNITS PO CAPS   Oral   Take 50,000 Units by mouth every 7 (seven) days. Take on Wednesdays           BP 118/66  Pulse 60  Temp 98.6 F (37 C) (Oral)  Resp 18  SpO2 100%  LMP 03/17/2012  Physical Exam  Nursing note and vitals reviewed. Constitutional: She is oriented to person, place, and time. She appears well-developed and well-nourished. No distress.  HENT:  Head: Normocephalic and atraumatic. No trismus in the jaw.  Right Ear: Tympanic membrane, external ear and ear canal normal.  Left Ear: Tympanic membrane, external ear and ear canal normal.  Nose: Nose normal. No rhinorrhea. Right sinus exhibits no maxillary sinus tenderness and no frontal sinus tenderness. Left sinus exhibits no maxillary sinus tenderness and no frontal sinus tenderness.  Mouth/Throat: Uvula is midline and mucous  membranes are normal. Normal dentition. No dental abscesses or uvula swelling. Oropharyngeal exudate and posterior oropharyngeal edema present. No posterior oropharyngeal erythema or tonsillar abscesses.       No submental edema, tongue not elevated, no trismus. No impending airway obstruction; Pt able to speak full sentences, swallow intact, no drooling, stridor, or tonsillar/uvula displacement. No palatal petechia  Eyes: Conjunctivae normal are normal.  Neck: Trachea  normal, normal range of motion and full passive range of motion without pain. Neck supple. No rigidity. Normal range of motion present. No Brudzinski's sign noted.       Flexion and extension of neck without pain or difficulty. Able to breath without difficulty in extension.  Cardiovascular: Normal rate and regular rhythm.   Pulmonary/Chest: Effort normal and breath sounds normal. No stridor. No respiratory distress. She has no wheezes.  Abdominal: Soft. There is no tenderness.       No obvious evidence of splenomegaly. Non ttp.   Musculoskeletal: Normal range of motion.  Lymphadenopathy:       Head (right side): No preauricular and no posterior auricular adenopathy present.       Head (left side): No preauricular and no posterior auricular adenopathy present.    She has cervical adenopathy.  Neurological: She is alert and oriented to person, place, and time.  Skin: Skin is warm and dry. No rash noted. She is not diaphoretic.  Psychiatric: She has a normal mood and affect.    ED Course  Procedures (including critical care time)   Labs Reviewed  RAPID STREP SCREEN   No results found.   No diagnosis found.    MDM  Sore throat  Pt afebrile with tonsillar exudate, cervical lymphadenopathy, & dysphagia; clinical diagnosis of strep. Treated in the Ed with steroids, NSAIDs, Pain medication and PCN IM.  Pt appears mildly dehydrated, discussed importance of water rehydration. Presentation non concerning for PTA or infxn spread to soft tissue. No trismus or uvula deviation. Specific return precautions discussed. Pt able to drink water in ED without difficulty with intact air way. Recommended PCP follow up.          Jaci Carrel, [place] 03/21/12 1552

## 2012-03-21 NOTE — ED Notes (Signed)
Pt c/o sore throat x 2-3 days. Pt took two tylenol last night.

## 2012-04-21 ENCOUNTER — Encounter: Payer: Self-pay | Admitting: Obstetrics and Gynecology

## 2012-04-21 ENCOUNTER — Ambulatory Visit (INDEPENDENT_AMBULATORY_CARE_PROVIDER_SITE_OTHER): Payer: Medicaid Other | Admitting: Obstetrics and Gynecology

## 2012-04-21 VITALS — BP 102/62 | Ht 67.0 in | Wt 221.0 lb

## 2012-04-21 DIAGNOSIS — R87811 Vaginal high risk human papillomavirus (HPV) DNA test positive: Secondary | ICD-10-CM

## 2012-04-21 DIAGNOSIS — N870 Mild cervical dysplasia: Secondary | ICD-10-CM | POA: Insufficient documentation

## 2012-04-21 DIAGNOSIS — Z9889 Other specified postprocedural states: Secondary | ICD-10-CM

## 2012-04-21 DIAGNOSIS — Z139 Encounter for screening, unspecified: Secondary | ICD-10-CM

## 2012-04-21 DIAGNOSIS — IMO0002 Reserved for concepts with insufficient information to code with codable children: Secondary | ICD-10-CM

## 2012-04-21 NOTE — Progress Notes (Signed)
Here for colpo secondary to ASCUS and +HRHPV  Filed Vitals:   04/21/12 1121  BP: 102/62   Colpo performed without difficulty TZ seen AW at 11 and 6 oclock  A/P Bx at the above sites ECC done RTO 2wks for f/u

## 2012-05-19 ENCOUNTER — Encounter: Payer: Self-pay | Admitting: Obstetrics and Gynecology

## 2012-05-19 ENCOUNTER — Ambulatory Visit (INDEPENDENT_AMBULATORY_CARE_PROVIDER_SITE_OTHER): Payer: Medicare Other | Admitting: Obstetrics and Gynecology

## 2012-05-19 VITALS — BP 118/78 | Wt 223.0 lb

## 2012-05-19 DIAGNOSIS — N870 Mild cervical dysplasia: Secondary | ICD-10-CM

## 2012-05-19 DIAGNOSIS — Z719 Counseling, unspecified: Secondary | ICD-10-CM

## 2012-05-19 NOTE — Progress Notes (Signed)
Patient ID: Dorothy Pham, female   DOB: February 06, 1986, 27 y.o.   MRN: [id number] Report Comments: FINAL DIAGNOSIS: A. Cervix- Biopsy, 6 o'clock:  Low grade squamous intraepithelial lesion (LSIL), mild dysplasia, CIN I.  See comment.   B. Cervix- Biopsy, 11 o'clock:  Fragments of benign endocervical mucosa.  - Negative for atypia.  No significant squamous mucosa is present.  C. Endocervix - Curettage:  Fragments of benign endocervical mucosa.  Negative for atypia.  Filed Vitals:   05/19/12 0959  BP: 118/78   Path reviewed - CIN 1  A/P Options and recs discussed rec repeat pap in 4-57mths

## 2012-07-20 ENCOUNTER — Emergency Department (HOSPITAL_COMMUNITY): Payer: Medicare Other

## 2012-07-20 ENCOUNTER — Encounter (HOSPITAL_COMMUNITY): Payer: Self-pay | Admitting: Emergency Medicine

## 2012-07-20 ENCOUNTER — Emergency Department (HOSPITAL_COMMUNITY)
Admission: EM | Admit: 2012-07-20 | Discharge: 2012-07-21 | Disposition: A | Discharge status: Home / Self Care | Payer: Medicare Other | Attending: Emergency Medicine | Admitting: Emergency Medicine

## 2012-07-20 DIAGNOSIS — F259 Schizoaffective disorder, unspecified: Secondary | ICD-10-CM | POA: Insufficient documentation

## 2012-07-20 DIAGNOSIS — R51 Headache: Secondary | ICD-10-CM | POA: Insufficient documentation

## 2012-07-20 DIAGNOSIS — H53149 Visual discomfort, unspecified: Secondary | ICD-10-CM | POA: Insufficient documentation

## 2012-07-20 DIAGNOSIS — Z79899 Other long term (current) drug therapy: Secondary | ICD-10-CM | POA: Insufficient documentation

## 2012-07-20 NOTE — ED Notes (Signed)
Patient with headache since yesterday.  Denies any nausea or vomiting.  Patient states it was worse yesterday.  Better today.  Patient has taken some OTC meds with not much relief.  Patient states she does have some photophobia.

## 2012-07-20 NOTE — ED Provider Notes (Signed)
History  This chart was scribed for non-physician practitioner working with Raeford Razor, MD by Ardeen Jourdain, ED Scribe. This patient was seen in room TR09C/TR09C and the patient's care was started at 2305.  CSN: [id number]  Arrival date & time 07/20/12  2029   None     Chief Complaint  Patient presents with  . Headache     Patient is a 27 y.o. female presenting with headaches. The history is provided by the patient. No language interpreter was used.  Headache Pain location:  Generalized Quality:  Dull Radiates to:  Does not radiate Onset quality:  Gradual Duration:  2 days Timing:  Constant Progression:  Improving Chronicity:  New Relieved by:  Nothing Worsened by:  Light Ineffective treatments:  Acetaminophen and NSAIDs Associated symptoms: photophobia   Associated symptoms: no abdominal pain, no back pain, no cough, no diarrhea, no facial pain, no fever, no nausea, no neck stiffness, no numbness, no sore throat, no visual change and no vomiting     Dorothy Pham is a 27 y.o. female with a h/o schizoaffective disorder who presents to the Emergency Department complaining of a gradual onset, gradually improving HA that began yesterday with associated slight photophobia. Her mother states the pt has had rhinorrhea for the past few days. She describes the pain as an aching pressure like feeling. She states the pain was worse yesterday and has been gradually improving. She reports taking OTC medication with slight relief. She denies any nausea, emesis, neck pain, CP, SOB, congestion and cough as associated symptoms. She denies any h/o similar HA.   Past Medical History  Diagnosis Date  . Mental disorder   . Schizoaffective disorder   . Complication of anesthesia   . PONV (postoperative nausea and vomiting)   . Schizoaffective disorder     Past Surgical History  Procedure Laterality Date  . Breast surgery  2012    reduction    Family History  Problem Relation Age of  Onset  . Anesthesia problems Neg Hx   . Diabetes Father   . Arthritis Father   . Arthritis Mother     History  Substance Use Topics  . Smoking status: Never Smoker   . Smokeless tobacco: Never Used  . Alcohol Use: No    OB History   Grav Para Term Preterm Abortions TAB SAB Ect Mult Living   1 0 0 0 0 0 0 0 0 0       Review of Systems  Constitutional: Negative for fever and chills.  HENT: Negative for sore throat and neck stiffness.   Eyes: Positive for photophobia.  Respiratory: Negative for cough and shortness of breath.   Cardiovascular: Negative for chest pain.  Gastrointestinal: Negative for nausea, vomiting, abdominal pain and diarrhea.  Musculoskeletal: Negative for back pain.  Neurological: Positive for headaches. Negative for weakness and numbness.  All other systems reviewed and are negative.    Allergies  Review of patient's allergies indicates no known allergies.  Home Medications   Current Outpatient Rx  Name  Route  Sig  Dispense  Refill  . ARIPiprazole (ABILIFY) 20 MG tablet   Oral   Take 20 mg by mouth daily.            Triage Vitals: BP 125/69  Pulse 94  Temp(Src) 97.9 F (36.6 C) (Oral)  Resp 17  SpO2 100%  LMP 07/11/2012  Physical Exam  Nursing note and vitals reviewed. Constitutional: She is oriented to person, place, and time. She appears  well-developed and well-nourished. No distress.  HENT:  Head: Normocephalic and atraumatic.  Right Ear: External ear normal.  Left Ear: External ear normal.  Frontal sinus pressure and tenderness, no purulent nasal discharge   Eyes: EOM are normal. Pupils are equal, round, and reactive to light.  Neck: Normal range of motion. Neck supple. No tracheal deviation present.  Cardiovascular: Normal rate, regular rhythm and normal heart sounds.  Exam reveals no gallop and no friction rub.   No murmur heard. Pulmonary/Chest: Effort normal and breath sounds normal. No respiratory distress. She has no  wheezes. She has no rales. She exhibits no tenderness.  Abdominal: Soft. Bowel sounds are normal. She exhibits no distension. There is no tenderness.  Musculoskeletal: Normal range of motion. She exhibits no edema.  Neurological: She is alert and oriented to person, place, and time.  Skin: Skin is warm and dry.  Psychiatric: She has a normal mood and affect. Her behavior is normal.    ED Course  Procedures (including critical care time)  DIAGNOSTIC STUDIES: Oxygen Saturation is 100% on room air, normal by my interpretation.    COORDINATION OF CARE:  11:12 PM: Discussed treatment plan which includes CT of the head with pt at bedside and pt agreed to plan.   07/21/12  0015:  Patient has not yet been for her CT.  Patient signed out to Dr. Norlene Campbell and Volney American, NP--disposition pending results.     Labs Reviewed - No data to display No results found.   No diagnosis found.    MDM    I personally performed the services described in this documentation, which was scribed in my presence. The recorded information has been reviewed and is accurate.       Jimmye Norman, NP 07/21/12 1428

## 2012-07-21 ENCOUNTER — Telehealth: Payer: Self-pay | Admitting: Obstetrics and Gynecology

## 2012-07-21 ENCOUNTER — Encounter (HOSPITAL_COMMUNITY): Payer: Self-pay | Admitting: Radiology

## 2012-07-21 MED ORDER — IBUPROFEN 800 MG PO TABS: 600.0000 mg | ORAL_TABLET | Freq: Three times a day (TID) | ORAL | Status: DC | PRN

## 2012-07-21 MED ORDER — IBUPROFEN 400 MG PO TABS
800.0000 mg | ORAL_TABLET | Freq: Once | ORAL | Status: AC
  Administered 2012-07-21: 800 mg via ORAL
  Filled 2012-07-21: qty 2

## 2012-07-21 NOTE — ED Provider Notes (Signed)
  Physical Exam  BP 94/64  Pulse 82  Temp(Src) 98.1 F (36.7 C) (Oral)  Resp 16  SpO2 99%  LMP 07/18/2012  Physical Exam Asked to review Ct Scan and DC home  ED Course  Procedures  MDM Negative CT Scan given Ibuprofen and FU with PCP      Arman Filter, NP 07/21/12 [id number]

## 2012-07-21 NOTE — ED Provider Notes (Signed)
Medical screening examination/treatment/procedure(s) were performed by non-physician practitioner and as supervising physician I was immediately available for consultation/collaboration.  Olivia Mackie, MD 07/21/12 [phone]

## 2012-07-22 ENCOUNTER — Encounter: Payer: Self-pay | Admitting: Obstetrics and Gynecology

## 2012-07-22 ENCOUNTER — Ambulatory Visit: Payer: Medicare Other | Admitting: Obstetrics and Gynecology

## 2012-07-22 VITALS — BP 100/72 | Resp 14 | Wt 213.0 lb

## 2012-07-22 DIAGNOSIS — R109 Unspecified abdominal pain: Secondary | ICD-10-CM

## 2012-07-22 DIAGNOSIS — N898 Other specified noninflammatory disorders of vagina: Secondary | ICD-10-CM

## 2012-07-22 DIAGNOSIS — R102 Pelvic and perineal pain: Secondary | ICD-10-CM

## 2012-07-22 MED ORDER — METRONIDAZOLE 500 MG PO TABS: 500.0000 mg | ORAL_TABLET | Freq: Two times a day (BID) | ORAL | Status: AC

## 2012-07-22 NOTE — ED Provider Notes (Signed)
Medical screening examination/treatment/procedure(s) were performed by non-physician practitioner and as supervising physician I was immediately available for consultation/collaboration.  Raeford Razor, MD 07/22/12 [phone]

## 2012-07-22 NOTE — Progress Notes (Signed)
HISTORY OF PRESENT ILLNESS  [name] is a 27 y.o. year old female,G1P0000, who presents for a problem visit. The patient has a history of pelvic pain.  She has been evaluated in the past.  She has a history of CIN-1.  She was recently seen in the emergency department because of migraine headaches.  Subjective:  The patient complains of a vaginal discharge with an odor.  She was to rule out sexual transmitted infections.  She says that her pelvic pain has returned.  She also complains of the pain in her right hip.  She denies dysuria, hematuria, frequency, and urgency.  Her GI function is normal.  She denies fever.  She denies nausea and vomiting.  Objective:  BP 100/72  Resp 14  Wt 213 lb (96.616 kg)  BMI 33.35 kg/m2  LMP 07/10/2012   General: no distress Resp: clear to auscultation bilaterally Cardio: regular rate and rhythm, S1, S2 normal, no murmur, click, rub or gallop GI: soft and nontender Back: no CVA tenderness  External genitalia: normal general appearance Vaginal: normal without tenderness, induration or masses Cervix: normal appearance and nontender Adnexa: normal bimanual exam Uterus: nontender, normal size shape and consistency  Wet prep: Positive clue cells, pH 4.5, whiff negative  Assessment:  Vaginal discharge  Pelvic pain  Plan:  Metronidazole 500 mg twice a day for 7 days.  The patient will see her family physician for evaluation of her migraine headaches.  No additional evaluation of pelvic pain today.  Return as needed or if symptoms worsen.  Leonard Schwartz M.D.  07/22/2012 3:01 PM    Color: none Odor: yes Itching:yes Thin:no Thick:yes Fever:no Dyspareunia:no Hx PID:no HX STD:yes Pelvic Pain:yes "pt unsure if it is from working out" Desires Gc/CT:yes Desires HIV,RPR,HbsAG:yes

## 2012-07-23 NOTE — Addendum Note (Signed)
Addended by: Tim Lair on: 07/23/2012 09:41 AM   Modules accepted: Orders

## 2012-07-24 LAB — GC/CHLAMYDIA PROBE AMP: CT Probe RNA: NEGATIVE

## 2012-08-09 ENCOUNTER — Emergency Department (HOSPITAL_COMMUNITY)
Admission: EM | Admit: 2012-08-09 | Discharge: 2012-08-09 | Disposition: A | Discharge status: Home / Self Care | Payer: Medicare Other | Attending: Emergency Medicine | Admitting: Emergency Medicine

## 2012-08-09 ENCOUNTER — Emergency Department (HOSPITAL_COMMUNITY): Payer: Medicare Other

## 2012-08-09 ENCOUNTER — Encounter (HOSPITAL_COMMUNITY): Payer: Self-pay | Admitting: *Deleted

## 2012-08-09 DIAGNOSIS — R05 Cough: Secondary | ICD-10-CM | POA: Insufficient documentation

## 2012-08-09 DIAGNOSIS — R1310 Dysphagia, unspecified: Secondary | ICD-10-CM | POA: Insufficient documentation

## 2012-08-09 DIAGNOSIS — R509 Fever, unspecified: Secondary | ICD-10-CM | POA: Insufficient documentation

## 2012-08-09 DIAGNOSIS — J209 Acute bronchitis, unspecified: Secondary | ICD-10-CM | POA: Insufficient documentation

## 2012-08-09 DIAGNOSIS — R0602 Shortness of breath: Secondary | ICD-10-CM | POA: Insufficient documentation

## 2012-08-09 DIAGNOSIS — F259 Schizoaffective disorder, unspecified: Secondary | ICD-10-CM | POA: Insufficient documentation

## 2012-08-09 DIAGNOSIS — R059 Cough, unspecified: Secondary | ICD-10-CM | POA: Insufficient documentation

## 2012-08-09 DIAGNOSIS — J069 Acute upper respiratory infection, unspecified: Secondary | ICD-10-CM | POA: Insufficient documentation

## 2012-08-09 DIAGNOSIS — R070 Pain in throat: Secondary | ICD-10-CM | POA: Insufficient documentation

## 2012-08-09 DIAGNOSIS — Z79899 Other long term (current) drug therapy: Secondary | ICD-10-CM | POA: Insufficient documentation

## 2012-08-09 DIAGNOSIS — R61 Generalized hyperhidrosis: Secondary | ICD-10-CM | POA: Insufficient documentation

## 2012-08-09 DIAGNOSIS — J40 Bronchitis, not specified as acute or chronic: Secondary | ICD-10-CM

## 2012-08-09 DIAGNOSIS — J029 Acute pharyngitis, unspecified: Secondary | ICD-10-CM | POA: Insufficient documentation

## 2012-08-09 LAB — CBC WITH DIFFERENTIAL/PLATELET
Basophils Relative: 0 % (ref 0–1)
Eosinophils Absolute: 0.1 10*3/uL (ref 0.0–0.7)
Eosinophils Relative: 1 % (ref 0–5)
HCT: 35.8 % — ABNORMAL LOW (ref 36.0–46.0)
Hemoglobin: 12.4 g/dL (ref 12.0–15.0)
Lymphs Abs: 1.3 10*3/uL (ref 0.7–4.0)
MCH: 30.8 pg (ref 26.0–34.0)
MCHC: 34.6 g/dL (ref 30.0–36.0)
MCV: 88.8 fL (ref 78.0–100.0)
Monocytes Absolute: 0.7 10*3/uL (ref 0.1–1.0)
Monocytes Relative: 12 % (ref 3–12)
RBC: 4.03 MIL/uL (ref 3.87–5.11)

## 2012-08-09 LAB — POCT I-STAT, CHEM 8
Calcium, Ion: 1.2 mmol/L (ref 1.12–1.23)
Creatinine, Ser: 0.8 mg/dL (ref 0.50–1.10)
Glucose, Bld: 80 mg/dL (ref 70–99)
HCT: 40 % (ref 36.0–46.0)
Hemoglobin: 13.6 g/dL (ref 12.0–15.0)
Potassium: 4.7 mEq/L (ref 3.5–5.1)
TCO2: 24 mmol/L (ref 0–100)

## 2012-08-09 MED ORDER — ALBUTEROL SULFATE (5 MG/ML) 0.5% IN NEBU
2.5000 mg | INHALATION_SOLUTION | Freq: Once | RESPIRATORY_TRACT | Status: AC
  Administered 2012-08-09: 2.5 mg via RESPIRATORY_TRACT
  Filled 2012-08-09: qty 0.5

## 2012-08-09 MED ORDER — FLUCONAZOLE 150 MG PO TABS: 150.0000 mg | ORAL_TABLET | Freq: Once | ORAL | Status: AC

## 2012-08-09 MED ORDER — PREDNISONE 20 MG PO TABS
60.0000 mg | ORAL_TABLET | Freq: Once | ORAL | Status: AC
  Administered 2012-08-09: 60 mg via ORAL
  Filled 2012-08-09: qty 3

## 2012-08-09 MED ORDER — AMOXICILLIN 500 MG PO CAPS: 1000.0000 mg | ORAL_CAPSULE | Freq: Two times a day (BID) | ORAL | Status: DC

## 2012-08-09 MED ORDER — PREDNISONE 50 MG PO TABS: 50.0000 mg | ORAL_TABLET | Freq: Every day | ORAL | Status: DC

## 2012-08-09 MED ORDER — IPRATROPIUM BROMIDE 0.02 % IN SOLN
0.5000 mg | Freq: Once | RESPIRATORY_TRACT | Status: AC
  Administered 2012-08-09: 0.5 mg via RESPIRATORY_TRACT
  Filled 2012-08-09: qty 2.5

## 2012-08-09 MED ORDER — ALBUTEROL SULFATE HFA 108 (90 BASE) MCG/ACT IN AERS: 2.0000 | INHALATION_SPRAY | RESPIRATORY_TRACT | Status: DC | PRN

## 2012-08-09 MED ORDER — AMOXICILLIN 500 MG PO CAPS
1000.0000 mg | ORAL_CAPSULE | Freq: Three times a day (TID) | ORAL | Status: DC
  Administered 2012-08-09: 1000 mg via ORAL
  Filled 2012-08-09: qty 1

## 2012-08-09 NOTE — ED Notes (Signed)
PT c/o sore throat, upper respiratory congestion, productive cough x 3 days when her grandma was smoking in house.  Today began having burning in her throat and chest.

## 2012-08-09 NOTE — ED Provider Notes (Signed)
History  This chart was scribed for Dione Booze, MD by Shari Heritage, ED Scribe. The patient was seen in room TR05C/TR05C. Patient's care was started at 1711.   CSN: [id number]  Arrival date & time 08/09/12  1543   First MD Initiated Contact with Patient 08/09/12 1711      Chief Complaint  Patient presents with  . URI  . Chest Pain    The history is provided by the patient. No language interpreter was used.    HPI Comments: Dorothy Pham is a 27 y.o. female with history of schizoaffective disorder who presents to the Emergency Department complaining of intermittent, moderate cough productive of yellow phlegm onset a few days ago. Patient reports burning, moderate to severe chest pain and throat pain that is worse with coughing. Patient rates pain as 10/10. There is associated shortness of breath, pain with swallowing, subjective fever, chills and diaphoresis. Temperature at triage was 99.4. Patient denies nausea, vomiting, or body aches. She has taken Robitussin and Benadryl at home with no relief. Patient does not smoke.   Past Medical History  Diagnosis Date  . Mental disorder   . Schizoaffective disorder   . Complication of anesthesia   . PONV (postoperative nausea and vomiting)   . Schizoaffective disorder     Past Surgical History  Procedure Laterality Date  . Breast surgery  2012    reduction    Family History  Problem Relation Age of Onset  . Anesthesia problems Neg Hx   . Diabetes Father   . Arthritis Father   . Arthritis Mother     History  Substance Use Topics  . Smoking status: Never Smoker   . Smokeless tobacco: Never Used  . Alcohol Use: No    OB History   Grav Para Term Preterm Abortions TAB SAB Ect Mult Living   1 0 0 0 0 0 0 0 0 0       Review of Systems  Constitutional: Positive for fever, chills and diaphoresis.  HENT: Positive for sore throat.   Respiratory: Positive for cough and shortness of breath.   Cardiovascular: Positive for chest  pain.  Gastrointestinal: Negative for nausea and vomiting.  Musculoskeletal: Negative for myalgias.  All other systems reviewed and are negative.    Allergies  Review of patient's allergies indicates no known allergies.  Home Medications   Current Outpatient Rx  Name  Route  Sig  Dispense  Refill  . ARIPiprazole (ABILIFY) 20 MG tablet   Oral   Take 20 mg by mouth at bedtime.          Marland Kitchen Dextromethorphan HBr (COUGH RELIEF PO)   Oral   Take 20 mLs by mouth every 6 (six) hours as needed (cough).         . DiphenhydrAMINE HCl (BENADRYL PO)   Oral   Take 1 tablet by mouth once.           Triage Vitals: BP 126/87  Pulse 112  Temp(Src) 99.4 F (37.4 C) (Oral)  Resp 20  SpO2 98%  LMP 07/10/2012  Physical Exam  Constitutional: She is oriented to person, place, and time. She appears well-developed and well-nourished.  HENT:  Head: Normocephalic and atraumatic.  Mouth/Throat: Posterior oropharyngeal erythema present.  Oropharynx is intensely erythematous. No tonsillar hypertrophy. No difficulty with secretions. Normal voice.  Eyes: Conjunctivae and EOM are normal. Pupils are equal, round, and reactive to light.  Neck: Normal range of motion. Neck supple.  Cardiovascular: Normal rate, regular rhythm  and normal heart sounds.   Pulmonary/Chest: Breath sounds normal. She has no wheezes. She has no rhonchi.  Prolonged exhalation phase without overt rales, wheezes or rhonchi.  Abdominal: Soft. Bowel sounds are normal. There is no tenderness.  Musculoskeletal: Normal range of motion.  Neurological: She is alert and oriented to person, place, and time.  Skin: Skin is warm and dry.  Psychiatric: She has a normal mood and affect. Her behavior is normal.    ED Course  Procedures (including critical care time) DIAGNOSTIC STUDIES: Oxygen Saturation is 98% on room air, normal by my interpretation.    COORDINATION OF CARE: 5:16 PM- Patient informed of current plan for treatment  and evaluation and agrees with plan at this time.   5:47 PM- Feels better after breathing treatment.   Results for orders placed during the hospital encounter of 08/09/12  CBC WITH DIFFERENTIAL      Result Value Range   WBC 6.1  4.0 - 10.5 K/uL   RBC 4.03  3.87 - 5.11 MIL/uL   Hemoglobin 12.4  12.0 - 15.0 g/dL   HCT 96.0 (*) 45.4 - 09.8 %   MCV 88.8  78.0 - 100.0 fL   MCH 30.8  26.0 - 34.0 pg   MCHC 34.6  30.0 - 36.0 g/dL   RDW 11.9  14.7 - 82.9 %   Platelets 352  150 - 400 K/uL   Neutrophils Relative 65  43 - 77 %   Neutro Abs 4.0  1.7 - 7.7 K/uL   Lymphocytes Relative 22  12 - 46 %   Lymphs Abs 1.3  0.7 - 4.0 K/uL   Monocytes Relative 12  3 - 12 %   Monocytes Absolute 0.7  0.1 - 1.0 K/uL   Eosinophils Relative 1  0 - 5 %   Eosinophils Absolute 0.1  0.0 - 0.7 K/uL   Basophils Relative 0  0 - 1 %   Basophils Absolute 0.0  0.0 - 0.1 K/uL  POCT I-STAT, CHEM 8      Result Value Range   Sodium 139  135 - 145 mEq/L   Potassium 4.7  3.5 - 5.1 mEq/L   Chloride 106  96 - 112 mEq/L   BUN 13  6 - 23 mg/dL   Creatinine, Ser 5.62  0.50 - 1.10 mg/dL   Glucose, Bld 80  70 - 99 mg/dL   Calcium, Ion 1.30  1.12 - 1.23 mmol/L   TCO2 24  0 - 100 mmol/L   Hemoglobin 13.6  12.0 - 15.0 g/dL   HCT 86.5  78.4 - 69.6 %   Dg Chest 2 View  08/09/2012  *RADIOLOGY REPORT*  Clinical Data: Chest pain, upper respiratory infection  CHEST - 2 VIEW  Comparison: None.  Findings: Cardiomediastinal silhouette appears normal.  No acute pulmonary disease is noted.  Bony thorax is intact.  IMPRESSION: No acute cardiopulmonary abnormality seen.   Original Report Authenticated By: Lupita Raider.,  M.D.     1. Bronchitis   2. Pharyngitis       MDM  Tract infection with bronchitis and pharyngitis. I am impressed by the severity of her pharyngitis and she will clearly need steroids. Because of production of purulent sputum, it is elected to start her on antibiotics. She's given albuterol with Atrovent in the  ED with significant improvement in cough. She is discharged with prescriptions for amoxicillin, prednisone, and albuterol inhaler.      I personally performed the services described in this  documentation, which was scribed in my presence. The recorded information has been reviewed and is accurate.      Dione Booze, MD 08/09/12 [phone]

## 2012-08-09 NOTE — ED Notes (Signed)
C/O sore throat and anterior cp with cough x 4-5 days.

## 2012-08-23 ENCOUNTER — Emergency Department (HOSPITAL_BASED_OUTPATIENT_CLINIC_OR_DEPARTMENT_OTHER): Payer: Medicare Other

## 2012-08-23 ENCOUNTER — Emergency Department (HOSPITAL_BASED_OUTPATIENT_CLINIC_OR_DEPARTMENT_OTHER)
Admission: EM | Admit: 2012-08-23 | Discharge: 2012-08-23 | Disposition: A | Discharge status: Home / Self Care | Payer: Medicare Other | Attending: Emergency Medicine | Admitting: Emergency Medicine

## 2012-08-23 ENCOUNTER — Encounter (HOSPITAL_BASED_OUTPATIENT_CLINIC_OR_DEPARTMENT_OTHER): Payer: Self-pay

## 2012-08-23 DIAGNOSIS — Z79899 Other long term (current) drug therapy: Secondary | ICD-10-CM | POA: Insufficient documentation

## 2012-08-23 DIAGNOSIS — R6883 Chills (without fever): Secondary | ICD-10-CM | POA: Insufficient documentation

## 2012-08-23 DIAGNOSIS — J069 Acute upper respiratory infection, unspecified: Secondary | ICD-10-CM

## 2012-08-23 DIAGNOSIS — F259 Schizoaffective disorder, unspecified: Secondary | ICD-10-CM | POA: Insufficient documentation

## 2012-08-23 DIAGNOSIS — R059 Cough, unspecified: Secondary | ICD-10-CM | POA: Insufficient documentation

## 2012-08-23 DIAGNOSIS — R05 Cough: Secondary | ICD-10-CM | POA: Insufficient documentation

## 2012-08-23 DIAGNOSIS — Z3202 Encounter for pregnancy test, result negative: Secondary | ICD-10-CM | POA: Insufficient documentation

## 2012-08-23 LAB — URINALYSIS, ROUTINE W REFLEX MICROSCOPIC
Bilirubin Urine: NEGATIVE
Hgb urine dipstick: NEGATIVE
Ketones, ur: NEGATIVE mg/dL
Specific Gravity, Urine: 1.035 — ABNORMAL HIGH (ref 1.005–1.030)
Urobilinogen, UA: 1 mg/dL (ref 0.0–1.0)

## 2012-08-23 MED ORDER — HYDROCODONE-ACETAMINOPHEN 5-325 MG PO TABS: 1.0000 | ORAL_TABLET | Freq: Four times a day (QID) | ORAL | Status: DC | PRN

## 2012-08-23 NOTE — ED Notes (Signed)
Patient reports that she was seen last week at [facility] and diagnosed with bronchitis. Has taken the medications and now complaining of right sided abdominal pain, chest wall pain with inspiration and breast tenderness.

## 2012-08-23 NOTE — ED Provider Notes (Signed)
History     CSN: [id number]  Arrival date & time 08/23/12  0105   First MD Initiated Contact with Patient 08/23/12 0120      Chief Complaint  Patient presents with  . Abdominal Pain    (Consider location/radiation/quality/duration/timing/severity/associated sxs/prior treatment) Patient is a 27 y.o. female presenting with abdominal pain. The history is provided by the patient.  Abdominal Pain Associated symptoms: chills and cough   Associated symptoms: no chest pain, no diarrhea, no nausea, no shortness of breath, no sore throat and no vomiting    Patient presents with cough and dull chest pain. She states she's had some sputum. She's had this for the last 3 weeks. She's been on antibiotics. She's had a previous sore throat, but that has resolved. She is but she now has some pain in her right abdomen. It is like she did too many sit-ups. No nausea vomiting or diarrhea. Is not associated with eating. No headache. No confusion. She states she may have had some chills. Past Medical History  Diagnosis Date  . Mental disorder   . Schizoaffective disorder   . Complication of anesthesia   . PONV (postoperative nausea and vomiting)   . Schizoaffective disorder     Past Surgical History  Procedure Laterality Date  . Breast surgery  2012    reduction    Family History  Problem Relation Age of Onset  . Anesthesia problems Neg Hx   . Diabetes Father   . Arthritis Father   . Arthritis Mother     History  Substance Use Topics  . Smoking status: Never Smoker   . Smokeless tobacco: Never Used  . Alcohol Use: No    OB History   Grav Para Term Preterm Abortions TAB SAB Ect Mult Living   1 0 0 0 0 0 0 0 0 0       Review of Systems  Constitutional: Positive for chills. Negative for activity change and appetite change.  HENT: Negative for sore throat, trouble swallowing and neck stiffness.   Eyes: Negative for pain.  Respiratory: Positive for cough. Negative for chest tightness  and shortness of breath.   Cardiovascular: Negative for chest pain and leg swelling.  Gastrointestinal: Positive for abdominal pain. Negative for nausea, vomiting and diarrhea.  Genitourinary: Negative for flank pain.  Musculoskeletal: Negative for back pain.  Skin: Negative for rash.  Neurological: Negative for weakness, numbness and headaches.  Psychiatric/Behavioral: Negative for behavioral problems.    Allergies  Review of patient's allergies indicates no known allergies.  Home Medications   Current Outpatient Rx  Name  Route  Sig  Dispense  Refill  . albuterol (PROVENTIL HFA;VENTOLIN HFA) 108 (90 BASE) MCG/ACT inhaler   Inhalation   Inhale 2 puffs into the lungs every 4 (four) hours as needed for wheezing (or coughing).   1 Inhaler   0   . amoxicillin (AMOXIL) 500 MG capsule   Oral   Take 2 capsules (1,000 mg total) by mouth 2 (two) times daily.   40 capsule   0   . predniSONE (DELTASONE) 50 MG tablet   Oral   Take 1 tablet (50 mg total) by mouth daily.   5 tablet   0   . ARIPiprazole (ABILIFY) 20 MG tablet   Oral   Take 20 mg by mouth at bedtime.          Marland Kitchen Dextromethorphan HBr (COUGH RELIEF PO)   Oral   Take 20 mLs by mouth every 6 (six)  hours as needed (cough).         . DiphenhydrAMINE HCl (BENADRYL PO)   Oral   Take 1 tablet by mouth once.         Marland Kitchen HYDROcodone-acetaminophen (NORCO/VICODIN) 5-325 MG per tablet   Oral   Take 1 tablet by mouth every 6 (six) hours as needed for pain (or coufh).   10 tablet   0     BP 123/72  Pulse 79  Temp(Src) 98.8 F (37.1 C) (Oral)  Resp 16  SpO2 100%  LMP 08/10/2012  Physical Exam  Nursing note and vitals reviewed. Constitutional: She is oriented to person, place, and time. She appears well-developed and well-nourished.  HENT:  Head: Normocephalic and atraumatic.  Eyes: EOM are normal. Pupils are equal, round, and reactive to light.  Neck: Normal range of motion. Neck supple.  Cardiovascular:  Normal rate, regular rhythm and normal heart sounds.   No murmur heard. Pulmonary/Chest: Effort normal and breath sounds normal. No respiratory distress. She has no wheezes. She has no rales.  Abdominal: Soft. Bowel sounds are normal. She exhibits no distension. There is no tenderness. There is no rebound and no guarding.  Musculoskeletal: Normal range of motion.  Neurological: She is alert and oriented to person, place, and time. No cranial nerve deficit.  Skin: Skin is warm and dry.  Psychiatric: She has a normal mood and affect. Her speech is normal.    ED Course  Procedures (including critical care time)  Labs Reviewed  URINALYSIS, ROUTINE W REFLEX MICROSCOPIC - Abnormal; Notable for the following:    Specific Gravity, Urine 1.035 (*)    All other components within normal limits  PREGNANCY, URINE   Dg Chest 2 View  08/23/2012  *RADIOLOGY REPORT*  Clinical Data: Superior chest pain, mild shortness of breath and cough.  CHEST - 2 VIEW  Comparison: Chest radiograph performed 08/09/2012  Findings: The lungs are well-aerated and clear.  There is no evidence of focal opacification, pleural effusion or pneumothorax.  The heart is normal in size; the mediastinal contour is within normal limits.  No acute osseous abnormalities are seen.  IMPRESSION: No acute cardiopulmonary process seen.   Original Report Authenticated By: Tonia Ghent, M.D.      1. URI (upper respiratory infection)       MDM  Patient with URI symptoms for the last 2 weeks. Somewhat improved but not completely. Has been coughing. Negative x-ray for pneumonia. Doubt pulmonary embolism. Abdominal pain may be related to coughing. She is nontender. Will be discharged home.        Juliet Rude. Rubin Payor, MD 08/23/12 [phone]

## 2012-11-10 ENCOUNTER — Encounter (HOSPITAL_COMMUNITY): Payer: Self-pay | Admitting: Emergency Medicine

## 2012-11-10 ENCOUNTER — Emergency Department (HOSPITAL_COMMUNITY): Payer: Medicare Other

## 2012-11-10 ENCOUNTER — Emergency Department (HOSPITAL_COMMUNITY)
Admission: EM | Admit: 2012-11-10 | Discharge: 2012-11-11 | Disposition: A | Discharge status: Home / Self Care | Payer: Medicare Other | Attending: Emergency Medicine | Admitting: Emergency Medicine

## 2012-11-10 DIAGNOSIS — F259 Schizoaffective disorder, unspecified: Secondary | ICD-10-CM | POA: Insufficient documentation

## 2012-11-10 DIAGNOSIS — F489 Nonpsychotic mental disorder, unspecified: Secondary | ICD-10-CM | POA: Insufficient documentation

## 2012-11-10 DIAGNOSIS — J029 Acute pharyngitis, unspecified: Secondary | ICD-10-CM

## 2012-11-10 DIAGNOSIS — R05 Cough: Secondary | ICD-10-CM | POA: Insufficient documentation

## 2012-11-10 DIAGNOSIS — R070 Pain in throat: Secondary | ICD-10-CM | POA: Insufficient documentation

## 2012-11-10 DIAGNOSIS — R509 Fever, unspecified: Secondary | ICD-10-CM | POA: Insufficient documentation

## 2012-11-10 DIAGNOSIS — M542 Cervicalgia: Secondary | ICD-10-CM | POA: Insufficient documentation

## 2012-11-10 DIAGNOSIS — Z79899 Other long term (current) drug therapy: Secondary | ICD-10-CM | POA: Insufficient documentation

## 2012-11-10 DIAGNOSIS — R51 Headache: Secondary | ICD-10-CM | POA: Insufficient documentation

## 2012-11-10 DIAGNOSIS — R059 Cough, unspecified: Secondary | ICD-10-CM | POA: Insufficient documentation

## 2012-11-10 DIAGNOSIS — J069 Acute upper respiratory infection, unspecified: Secondary | ICD-10-CM

## 2012-11-10 LAB — BASIC METABOLIC PANEL
BUN: 14 mg/dL (ref 6–23)
Calcium: 9.3 mg/dL (ref 8.4–10.5)
Creatinine, Ser: 0.77 mg/dL (ref 0.50–1.10)
GFR calc Af Amer: >90 mL/min (ref >90)
GFR calc non Af Amer: >90 mL/min (ref >90)
Glucose, Bld: 90 mg/dL (ref 70–99)
Potassium: 4.3 mEq/L (ref 3.5–5.1)

## 2012-11-10 LAB — CBC
HCT: 33.1 % — ABNORMAL LOW (ref 36.0–46.0)
Hemoglobin: 11.2 g/dL — ABNORMAL LOW (ref 12.0–15.0)
MCH: 30.7 pg (ref 26.0–34.0)
MCHC: 33.8 g/dL (ref 30.0–36.0)
MCV: 90.7 fL (ref 78.0–100.0)
RDW: 13.2 % (ref 11.5–15.5)

## 2012-11-10 MED ORDER — BENZONATATE 200 MG PO CAPS: 200.0000 mg | ORAL_CAPSULE | Freq: Three times a day (TID) | ORAL | Status: DC | PRN

## 2012-11-10 MED ORDER — MENTHOL 3 MG MT LOZG: 1.0000 | LOZENGE | OROMUCOSAL | Status: DC | PRN

## 2012-11-10 MED ORDER — BENZONATATE 100 MG PO CAPS
200.0000 mg | ORAL_CAPSULE | Freq: Three times a day (TID) | ORAL | Status: DC | PRN
  Administered 2012-11-10: 200 mg via ORAL
  Filled 2012-11-10: qty 2

## 2012-11-10 MED ORDER — MENTHOL 3 MG MT LOZG
1.0000 | LOZENGE | OROMUCOSAL | Status: DC | PRN
  Filled 2012-11-10: qty 9

## 2012-11-10 NOTE — ED Notes (Signed)
No changes from triage assessment 

## 2012-11-10 NOTE — ED Notes (Addendum)
PT. REPORTS MID CHEST PAIN RADIATING TO NECK WITH HEAD PRESSURE ONSET YESTERDAY  , DENIES SOB / NAUSEA OR DIAPHORESIS .

## 2012-11-11 NOTE — ED Provider Notes (Addendum)
History    CSN: [id number] Arrival date & time 11/10/12  2007  First MD Initiated Contact with Patient 11/10/12 2258     Chief Complaint  Patient presents with  . Chest Pain   (Consider location/radiation/quality/duration/timing/severity/associated sxs/prior Treatment) HPI 27 yo female presents to the ER with complaint of throat pain, sinus drainage, headache, chest pain.  Sxs started yesterday, but have worsened today.  She has had subjective fevers, chills.  She has taken ibuprofen without improvement.  Dry cough.  Throat and neck pain worse with cough.  No sick contacts.  No smoking.  No h/o asthma. Mother is concerned about possible strep throat. Past Medical History  Diagnosis Date  . Mental disorder   . Schizoaffective disorder   . Complication of anesthesia   . PONV (postoperative nausea and vomiting)   . Schizoaffective disorder    Past Surgical History  Procedure Laterality Date  . Breast surgery  2012    reduction   Family History  Problem Relation Age of Onset  . Anesthesia problems Neg Hx   . Diabetes Father   . Arthritis Father   . Arthritis Mother    History  Substance Use Topics  . Smoking status: Never Smoker   . Smokeless tobacco: Never Used  . Alcohol Use: No   OB History   Grav Para Term Preterm Abortions TAB SAB Ect Mult Living   1 0 0 0 0 0 0 0 0 0      Review of Systems  All other systems reviewed and are negative.    Allergies  Review of patient's allergies indicates no known allergies.  Home Medications   Current Outpatient Rx  Name  Route  Sig  Dispense  Refill  . albuterol (PROVENTIL HFA;VENTOLIN HFA) 108 (90 BASE) MCG/ACT inhaler   Inhalation   Inhale 2 puffs into the lungs every 6 (six) hours as needed for wheezing.         . ARIPiprazole (ABILIFY) 20 MG tablet   Oral   Take 20 mg by mouth at bedtime.          Marland Kitchen ibuprofen (ADVIL,MOTRIN) 200 MG tablet   Oral   Take 800 mg by mouth every 8 (eight) hours as needed for  pain.         . benzonatate (TESSALON) 200 MG capsule   Oral   Take 1 capsule (200 mg total) by mouth 3 (three) times daily as needed for cough.   20 capsule   0   . menthol-cetylpyridinium (CEPACOL) 3 MG lozenge   Oral   Take 1 lozenge (3 mg total) by mouth as needed.   20 tablet   0    BP 124/66  Pulse 76  Temp(Src) 98.6 F (37 C) (Oral)  Resp 20  SpO2 100%  LMP 11/04/2012 Physical Exam  Nursing note and vitals reviewed. Constitutional: She is oriented to person, place, and time. She appears well-developed and well-nourished. She appears distressed.  HENT:  Head: Normocephalic and atraumatic.  Right Ear: External ear normal.  Left Ear: External ear normal.  Nose: Nose normal.  Mouth/Throat: Oropharynx is clear and moist.  Eyes: Conjunctivae and EOM are normal. Pupils are equal, round, and reactive to light.  Neck: Normal range of motion. Neck supple. No JVD present. No tracheal deviation present. No thyromegaly present.  Cardiovascular: Normal rate, regular rhythm, normal heart sounds and intact distal pulses.  Exam reveals no gallop and no friction rub.   No murmur heard. Pulmonary/Chest: Effort  normal and breath sounds normal. No stridor. No respiratory distress. She has no wheezes. She has no rales. She exhibits no tenderness.  Abdominal: Soft. Bowel sounds are normal. She exhibits no distension and no mass. There is no tenderness. There is no rebound and no guarding.  Musculoskeletal: Normal range of motion. She exhibits no edema and no tenderness.  Lymphadenopathy:    She has no cervical adenopathy.  Neurological: She is alert and oriented to person, place, and time. She exhibits normal muscle tone. Coordination normal.  Skin: Skin is warm and dry. No rash noted. No erythema. No pallor.  Psychiatric: She has a normal mood and affect. Her behavior is normal. Judgment and thought content normal.    ED Course  Procedures (including critical care time) Labs  Reviewed  CBC - Abnormal; Notable for the following:    RBC 3.65 (*)    Hemoglobin 11.2 (*)    HCT 33.1 (*)    All other components within normal limits  BASIC METABOLIC PANEL  POCT I-STAT TROPONIN I   Date: 11/11/2012  Rate: 72  Rhythm: normal sinus rhythm  QRS Axis: normal  Intervals: normal  ST/T Wave abnormalities: normal  Conduction Disutrbances:none  Narrative Interpretation:   Old EKG Reviewed: none available    Dg Chest 2 View  11/10/2012   *RADIOLOGY REPORT*  Clinical Data: Chest pain, shortness of breath  CHEST - 2 VIEW  Comparison: Prior radiograph from 08/23/2012  Findings: Cardiac and mediastinal silhouettes are stable in size and contour, and remain within normal limits.  The lungs are normally inflated.  There is no airspace consolidation to suggest acute infectious pneumonitis.  There is no pulmonary edema or pleural effusion.  No pneumothorax.  No acute osseous abnormality.  IMPRESSION: No acute cardiopulmonary process.   Original Report Authenticated By: Rise Mu, M.D.   1. Upper respiratory infection   2. Pharyngitis     MDM  27 yo female with 2 days of URI sxs.  Workup unremarkable.  Will treat symptomatically.  Olivia Mackie, MD 11/11/12 [id number]  Olivia Mackie, MD 11/11/12 [phone]

## 2013-01-02 ENCOUNTER — Emergency Department (HOSPITAL_COMMUNITY)
Admission: EM | Admit: 2013-01-02 | Discharge: 2013-01-02 | Disposition: A | Discharge status: Home / Self Care | Payer: Medicare Other | Attending: Emergency Medicine | Admitting: Emergency Medicine

## 2013-01-02 ENCOUNTER — Encounter (HOSPITAL_COMMUNITY): Payer: Self-pay | Admitting: Cardiology

## 2013-01-02 DIAGNOSIS — R51 Headache: Secondary | ICD-10-CM | POA: Insufficient documentation

## 2013-01-02 DIAGNOSIS — K056 Periodontal disease, unspecified: Secondary | ICD-10-CM | POA: Insufficient documentation

## 2013-01-02 DIAGNOSIS — F489 Nonpsychotic mental disorder, unspecified: Secondary | ICD-10-CM | POA: Insufficient documentation

## 2013-01-02 DIAGNOSIS — K068 Other specified disorders of gingiva and edentulous alveolar ridge: Secondary | ICD-10-CM

## 2013-01-02 DIAGNOSIS — R6884 Jaw pain: Secondary | ICD-10-CM | POA: Insufficient documentation

## 2013-01-02 DIAGNOSIS — R1310 Dysphagia, unspecified: Secondary | ICD-10-CM | POA: Insufficient documentation

## 2013-01-02 DIAGNOSIS — F259 Schizoaffective disorder, unspecified: Secondary | ICD-10-CM | POA: Insufficient documentation

## 2013-01-02 DIAGNOSIS — Z79899 Other long term (current) drug therapy: Secondary | ICD-10-CM | POA: Insufficient documentation

## 2013-01-02 MED ORDER — HYDROCODONE-ACETAMINOPHEN 5-325 MG PO TABS
2.0000 | ORAL_TABLET | Freq: Once | ORAL | Status: AC
  Administered 2013-01-02: 2 via ORAL
  Filled 2013-01-02: qty 2

## 2013-01-02 MED ORDER — MAGIC MOUTHWASH W/LIDOCAINE: 5.0000 mL | Freq: Four times a day (QID) | ORAL | Status: DC | PRN

## 2013-01-02 NOTE — ED Provider Notes (Signed)
Medical screening examination/treatment/procedure(s) were performed by non-physician practitioner and as supervising physician I was immediately available for consultation/collaboration.   Shanna Cisco, MD 01/02/13 [phone]

## 2013-01-02 NOTE — ED Provider Notes (Signed)
CSN: [id number]     Arrival date & time 01/02/13  1526 History  This chart was scribed for non-physician practitioner Antony Madura, PA-C working with Shanna Cisco, MD by Danella Maiers, ED Scribe. This patient was seen in room TR05C/TR05C and the patient's care was started at 3:33 PM.    Chief Complaint  Patient presents with  . Dental Pain    Patient is a 27 y.o. female presenting with tooth pain. The history is provided by the patient. No language interpreter was used.  Dental Pain Location:  Upper Quality:  Throbbing Severity:  Moderate Duration:  3 days Timing:  Constant Progression:  Worsening Relieved by:  Nothing Worsened by:  Cold food/drink Ineffective treatments:  NSAIDs Associated symptoms: difficulty swallowing (discomfort with swallowing, has ability to swallow) and facial pain   Associated symptoms: no drooling and no fever    HPI Comments: Dorothy Pham is a 27 y.o. female who presents to the Emergency Department complaining of constant, worsening, throbbing, left upper jaw pain onset 3 days ago that radiates up to her face. Pt claims that nothing eases the pain. Pt reports associated pain with swallowing. Pt has been taking ibuprofen with no relief. She had two teeth pulled in the area in the past. Pt claims eating and drinking worsens the pain.  Pt denies fever, eye pain, eye redness, trouble swallowing, ear drainage, and drooling. She has not called or been seen by a dentist for these symptoms. Patient denies smoking and drinking.    Past Medical History  Diagnosis Date  . Mental disorder   . Schizoaffective disorder   . Complication of anesthesia   . PONV (postoperative nausea and vomiting)   . Schizoaffective disorder    Past Surgical History  Procedure Laterality Date  . Breast surgery  2012    reduction   Family History  Problem Relation Age of Onset  . Anesthesia problems Neg Hx   . Diabetes Father   . Arthritis Father   . Arthritis Mother     History  Substance Use Topics  . Smoking status: Never Smoker   . Smokeless tobacco: Never Used  . Alcohol Use: No   OB History   Grav Para Term Preterm Abortions TAB SAB Ect Mult Living   1 0 0 0 0 0 0 0 0 0      Review of Systems  Constitutional: Negative for fever.  HENT: Positive for dental problem. Negative for drooling, trouble swallowing and ear discharge.   Eyes: Negative for pain and redness.  All other systems reviewed and are negative.   Allergies  Review of patient's allergies indicates no known allergies.  Home Medications   Current Outpatient Rx  Name  Route  Sig  Dispense  Refill  . ARIPiprazole (ABILIFY) 20 MG tablet   Oral   Take 20 mg by mouth at bedtime.          Marland Kitchen ibuprofen (ADVIL,MOTRIN) 200 MG tablet   Oral   Take 800 mg by mouth every 8 (eight) hours as needed for pain.         Marland Kitchen albuterol (PROVENTIL HFA;VENTOLIN HFA) 108 (90 BASE) MCG/ACT inhaler   Inhalation   Inhale 2 puffs into the lungs every 6 (six) hours as needed for wheezing.         . Alum & Mag Hydroxide-Simeth (MAGIC MOUTHWASH W/LIDOCAINE) SOLN   Oral   Take 5 mLs by mouth 4 (four) times daily as needed. Swish and swallow four times a  day as needed.   100 mL   0     Bendaryl, Maalox, Nystatin, Lidocaine HCL    BP 112/70  Pulse 72  Temp(Src) 98.8 F (37.1 C) (Oral)  Resp 18  SpO2 99% Physical Exam  Nursing note and vitals reviewed. Constitutional: She is oriented to person, place, and time. She appears well-developed and well-nourished. No distress.  HENT:  Head: Normocephalic and atraumatic.  Right Ear: Tympanic membrane, external ear and ear canal normal. No mastoid tenderness.  Left Ear: Tympanic membrane, external ear and ear canal normal. No mastoid tenderness.  Nose: Nose normal.  Mouth/Throat: Uvula is midline and oropharynx is clear and moist. Abnormal dentition. Dental caries present. No dental abscesses.  TM, canals, and external ear normal. No mastoid  tenderness. Airway patent. Erythema of left upper gingiva. No oral ulcerations, bleeding, or swelling. No area fluctuance. Caries and some abnormal dentition. No trismus.    Eyes: Conjunctivae and EOM are normal. No scleral icterus.  Neck: Normal range of motion.  Cardiovascular: Normal rate and regular rhythm.   Pulmonary/Chest: Effort normal and breath sounds normal. No stridor. No respiratory distress. She has no wheezes. She has no rales.  Musculoskeletal: Normal range of motion.  Neurological: She is alert and oriented to person, place, and time.  Skin: Skin is warm and dry. No rash noted. She is not diaphoretic. No erythema. No pallor.  Psychiatric: She has a normal mood and affect. Her behavior is normal.    ED Course   Medications  HYDROcodone-acetaminophen (NORCO/VICODIN) 5-325 MG per tablet 2 tablet (2 tablets Oral Given 01/02/13 1601)    DIAGNOSTIC STUDIES: Oxygen Saturation is 99% on room air, normal by my interpretation.    COORDINATION OF CARE: 3:38 PM- Discussed treatment plan with pt which includes pain management and dentist referral and pt agrees to plan.  Procedures (including critical care time)  Labs Reviewed - No data to display No results found.  1. Gingival erythema    MDM  Uncomplicated gingival erythema - patient as well and nontoxic appearing and afebrile. Patient tolerating secretions without difficulty or drooling. Airway patent. There is no trismus or stridor. No area fluctuance to suspect drainable dental abscess. No red flags or signs concerning for Ludwig's angina. Uvula midline without evidence of peritonsillar abscess. Patient given 2 Norco in ED for pain. Appropriate for discharge with dental followup. Prescription for Magic mouthwash with lidocaine given for symptoms. Ibuprofen recommended for continued discomfort. Patient agreeable to plan with no unaddressed concerns.  Patient and relative now requesting oral narcotic pain medication for  symptoms. As stated to the patient repeatedly that I do not believe narcotic pain medication is indicated for patient's symptoms. Patient stable for d/c.  I personally performed the services described in this documentation, which was scribed in my presence. The recorded information has been reviewed and is accurate.     Antony Madura, PA-C 01/02/13 1623

## 2013-01-02 NOTE — ED Notes (Signed)
Pt reports left sided dental pain for the past couple of days. States that the pain radiates up into her face.

## 2013-02-15 ENCOUNTER — Emergency Department (HOSPITAL_BASED_OUTPATIENT_CLINIC_OR_DEPARTMENT_OTHER)
Admission: EM | Admit: 2013-02-15 | Discharge: 2013-02-15 | Disposition: A | Discharge status: Home / Self Care | Payer: Medicare Other | Attending: Emergency Medicine | Admitting: Emergency Medicine

## 2013-02-15 ENCOUNTER — Encounter (HOSPITAL_BASED_OUTPATIENT_CLINIC_OR_DEPARTMENT_OTHER): Payer: Self-pay | Admitting: *Deleted

## 2013-02-15 DIAGNOSIS — M545 Low back pain, unspecified: Secondary | ICD-10-CM | POA: Insufficient documentation

## 2013-02-15 DIAGNOSIS — Z79899 Other long term (current) drug therapy: Secondary | ICD-10-CM | POA: Insufficient documentation

## 2013-02-15 DIAGNOSIS — IMO0001 Reserved for inherently not codable concepts without codable children: Secondary | ICD-10-CM | POA: Insufficient documentation

## 2013-02-15 DIAGNOSIS — N898 Other specified noninflammatory disorders of vagina: Secondary | ICD-10-CM | POA: Insufficient documentation

## 2013-02-15 DIAGNOSIS — Z3202 Encounter for pregnancy test, result negative: Secondary | ICD-10-CM | POA: Insufficient documentation

## 2013-02-15 DIAGNOSIS — R300 Dysuria: Secondary | ICD-10-CM

## 2013-02-15 DIAGNOSIS — M791 Myalgia, unspecified site: Secondary | ICD-10-CM

## 2013-02-15 DIAGNOSIS — F259 Schizoaffective disorder, unspecified: Secondary | ICD-10-CM | POA: Insufficient documentation

## 2013-02-15 LAB — URINALYSIS, ROUTINE W REFLEX MICROSCOPIC
Bilirubin Urine: NEGATIVE
Glucose, UA: NEGATIVE mg/dL
Hgb urine dipstick: NEGATIVE
Ketones, ur: NEGATIVE mg/dL
Leukocytes, UA: NEGATIVE
Protein, ur: NEGATIVE mg/dL
pH: 7 (ref 5.0–8.0)

## 2013-02-15 LAB — WET PREP, GENITAL: Trich, Wet Prep: NONE SEEN

## 2013-02-15 NOTE — ED Notes (Signed)
Pt c/o freq urination and lower back pain x 1 week

## 2013-02-15 NOTE — ED Provider Notes (Signed)
CSN: [id number]     Arrival date & time 02/15/13  1922 History  This chart was scribed for Dorothy Pham B. Bernette Mayers, MD by Dorothey Baseman, ED Scribe. This patient was seen in room MH02/MH02 and the patient's care was started at 10:25 PM.    Chief Complaint  Patient presents with  . Urinary Frequency   The history is provided by the patient. No language interpreter was used.   HPI Comments: Dorothy Pham is a 27 y.o. female who presents to the Emergency Department complaining of an intermittent, sharp, shooting pain to lower back that she describes as "radiating upwards and across her body" but unable to actually localize the pain. Onset several days ago with associated urinary frequency and urinary urgency onset 1-2 weeks ago. She reports that she has had these types of symptoms before, but has not followed up for them. She reports some associated vaginal bleeding and discharge. She denies fever. Patient reports that she has been sexually active and uses protection. Patient reports a previous history of pregnancy that resulted with a therapeutic abortion at 5 months due to some genetic problems and complications.   PCP- Dr. Parke Simmers  Past Medical History  Diagnosis Date  . Mental disorder   . Schizoaffective disorder   . Complication of anesthesia   . PONV (postoperative nausea and vomiting)   . Schizoaffective disorder    Past Surgical History  Procedure Laterality Date  . Breast surgery  2012    reduction   Family History  Problem Relation Age of Onset  . Anesthesia problems Neg Hx   . Diabetes Father   . Arthritis Father   . Arthritis Mother    History  Substance Use Topics  . Smoking status: Never Smoker   . Smokeless tobacco: Never Used  . Alcohol Use: No   OB History   Grav Para Term Preterm Abortions TAB SAB Ect Mult Living   1 0 0 0 0 0 0 0 0 0      Review of Systems  A complete 10 system review of systems was obtained and all systems are negative except as noted in the HPI  and PMH.   Allergies  Review of patient's allergies indicates no known allergies.  Home Medications   Current Outpatient Rx  Name  Route  Sig  Dispense  Refill  . albuterol (PROVENTIL HFA;VENTOLIN HFA) 108 (90 BASE) MCG/ACT inhaler   Inhalation   Inhale 2 puffs into the lungs every 6 (six) hours as needed for wheezing.         . Alum & Mag Hydroxide-Simeth (MAGIC MOUTHWASH W/LIDOCAINE) SOLN   Oral   Take 5 mLs by mouth 4 (four) times daily as needed. Swish and swallow four times a day as needed.   100 mL   0     Bendaryl, Maalox, Nystatin, Lidocaine HCL   . ARIPiprazole (ABILIFY) 20 MG tablet   Oral   Take 20 mg by mouth at bedtime.          Marland Kitchen ibuprofen (ADVIL,MOTRIN) 200 MG tablet   Oral   Take 800 mg by mouth every 8 (eight) hours as needed for pain.          Triage Vitals: Pulse 76  Temp(Src) 98.5 F (36.9 C) (Oral)  Resp 16  Wt 213 lb (96.616 kg)  BMI 33.35 kg/m2  SpO2 99%  LMP 02/01/2013  Physical Exam  Nursing note and vitals reviewed. Constitutional: She is oriented to person, place, and time. She appears  well-developed and well-nourished.  HENT:  Head: Normocephalic and atraumatic.  Eyes: EOM are normal. Pupils are equal, round, and reactive to light.  Neck: Normal range of motion. Neck supple.  Cardiovascular: Normal rate, regular rhythm, normal heart sounds and intact distal pulses.   Pulmonary/Chest: Effort normal and breath sounds normal. No respiratory distress.  Abdominal: Bowel sounds are normal. She exhibits no distension. There is no tenderness.  Genitourinary: Cervix exhibits no motion tenderness and no discharge. Right adnexum displays no mass and no tenderness. Left adnexum displays no mass and no tenderness. No bleeding around the vagina. No foreign body around the vagina. No vaginal discharge found.  Musculoskeletal: Normal range of motion. She exhibits no edema and no tenderness.  Neurological: She is alert and oriented to person, place,  and time. She has normal strength. No cranial nerve deficit or sensory deficit.  Skin: Skin is warm and dry. No rash noted.  Psychiatric: She has a normal mood and affect. Her behavior is normal.    ED Course  Procedures (including critical care time)  DIAGNOSTIC STUDIES: Oxygen Saturation is 99% on room air, normal by my interpretation.    COORDINATION OF CARE: 10:29PM- Discussed normal UA results. Will order additional labs for possibility of a vaginal infection. Advised patient to follow up with her PCP for the back pain. Discussed treatment plan with patient at bedside and patient verbalized agreement.     Labs Review Labs Reviewed  WET PREP, GENITAL - Abnormal; Notable for the following:    Clue Cells Wet Prep HPF POC FEW (*)    All other components within normal limits  GC/CHLAMYDIA PROBE AMP  URINALYSIS, ROUTINE W REFLEX MICROSCOPIC  PREGNANCY, URINE  RPR  HIV ANTIBODY (ROUTINE TESTING)   Imaging Review No results found.  MDM   1. Dysuria   2. Myalgia     Unclear etiology of shooting pains, does not appear to be EMC. Dysuria but no UTI or evidence of cervicitis. Advised PCP follow for both.   I personally performed the services described in this documentation, which was scribed in my presence. The recorded information has been reviewed and is accurate.       [name], MD 02/15/13 2321

## 2013-02-16 LAB — HIV ANTIBODY (ROUTINE TESTING W REFLEX): HIV: NONREACTIVE

## 2013-02-16 LAB — GC/CHLAMYDIA PROBE AMP
CT Probe RNA: NEGATIVE
GC Probe RNA: NEGATIVE

## 2013-03-22 ENCOUNTER — Emergency Department (HOSPITAL_BASED_OUTPATIENT_CLINIC_OR_DEPARTMENT_OTHER)
Admission: EM | Admit: 2013-03-22 | Discharge: 2013-03-23 | Disposition: A | Discharge status: Home / Self Care | Payer: Medicare Other | Attending: Emergency Medicine | Admitting: Emergency Medicine

## 2013-03-22 ENCOUNTER — Encounter (HOSPITAL_BASED_OUTPATIENT_CLINIC_OR_DEPARTMENT_OTHER): Payer: Self-pay | Admitting: Emergency Medicine

## 2013-03-22 DIAGNOSIS — R102 Pelvic and perineal pain: Secondary | ICD-10-CM

## 2013-03-22 DIAGNOSIS — Z3202 Encounter for pregnancy test, result negative: Secondary | ICD-10-CM | POA: Insufficient documentation

## 2013-03-22 DIAGNOSIS — N949 Unspecified condition associated with female genital organs and menstrual cycle: Secondary | ICD-10-CM | POA: Insufficient documentation

## 2013-03-22 DIAGNOSIS — Z79899 Other long term (current) drug therapy: Secondary | ICD-10-CM | POA: Insufficient documentation

## 2013-03-22 DIAGNOSIS — F259 Schizoaffective disorder, unspecified: Secondary | ICD-10-CM | POA: Insufficient documentation

## 2013-03-22 NOTE — ED Notes (Signed)
Pt c/o middle lower abd pain with nausea since yesterday. She sts the pain has gotten worse today. She denies dysuria, odor or discharge.

## 2013-03-23 ENCOUNTER — Ambulatory Visit (HOSPITAL_BASED_OUTPATIENT_CLINIC_OR_DEPARTMENT_OTHER)
Admission: RE | Admit: 2013-03-23 | Discharge: 2013-03-23 | Disposition: A | Discharge status: Home / Self Care | Payer: Medicare Other | Source: Ambulatory Visit | Attending: Emergency Medicine | Admitting: Emergency Medicine

## 2013-03-23 ENCOUNTER — Ambulatory Visit (HOSPITAL_BASED_OUTPATIENT_CLINIC_OR_DEPARTMENT_OTHER)
Admit: 2013-03-23 | Discharge: 2013-03-23 | Disposition: A | Discharge status: Home / Self Care | Payer: Medicare Other | Attending: Emergency Medicine | Admitting: Emergency Medicine

## 2013-03-23 ENCOUNTER — Other Ambulatory Visit (HOSPITAL_BASED_OUTPATIENT_CLINIC_OR_DEPARTMENT_OTHER): Payer: Self-pay | Admitting: Emergency Medicine

## 2013-03-23 DIAGNOSIS — R102 Pelvic and perineal pain: Secondary | ICD-10-CM

## 2013-03-23 LAB — URINALYSIS, ROUTINE W REFLEX MICROSCOPIC
Bilirubin Urine: NEGATIVE
Glucose, UA: NEGATIVE mg/dL
Hgb urine dipstick: NEGATIVE
Protein, ur: NEGATIVE mg/dL

## 2013-03-23 LAB — WET PREP, GENITAL: Yeast Wet Prep HPF POC: NONE SEEN

## 2013-03-23 LAB — PREGNANCY, URINE: Preg Test, Ur: NEGATIVE

## 2013-03-23 MED ORDER — HYDROCODONE-ACETAMINOPHEN 5-325 MG PO TABS: 1.0000 | ORAL_TABLET | Freq: Four times a day (QID) | ORAL | Status: DC | PRN

## 2013-03-23 NOTE — ED Provider Notes (Signed)
CSN: [id number]     Arrival date & time 03/22/13  2344 History   First MD Initiated Contact with Patient 03/23/13 0237     Chief Complaint  Patient presents with  . Abdominal Pain   (Consider location/radiation/quality/duration/timing/severity/associated sxs/prior Treatment) HPI This is a 27 year old female with the fairly sudden onset of suprapubic pain about 24 hours ago. The pain is worsening and is now moderate to severe. It is worse with walking or palpation but not with urination. She denies fever, chills, abdominal distention, vaginal bleeding or vaginal discharge.  Past Medical History  Diagnosis Date  . Mental disorder   . Schizoaffective disorder   . Complication of anesthesia   . PONV (postoperative nausea and vomiting)   . Schizoaffective disorder    Past Surgical History  Procedure Laterality Date  . Breast surgery  2012    reduction   Family History  Problem Relation Age of Onset  . Anesthesia problems Neg Hx   . Diabetes Father   . Arthritis Father   . Arthritis Mother    History  Substance Use Topics  . Smoking status: Never Smoker   . Smokeless tobacco: Never Used  . Alcohol Use: No   OB History   Grav Para Term Preterm Abortions TAB SAB Ect Mult Living   1 0 0 0 0 0 0 0 0 0      Review of Systems  All other systems reviewed and are negative.    Allergies  Review of patient's allergies indicates no known allergies.  Home Medications   Current Outpatient Rx  Name  Route  Sig  Dispense  Refill  . albuterol (PROVENTIL HFA;VENTOLIN HFA) 108 (90 BASE) MCG/ACT inhaler   Inhalation   Inhale 2 puffs into the lungs every 6 (six) hours as needed for wheezing.         . Alum & Mag Hydroxide-Simeth (MAGIC MOUTHWASH W/LIDOCAINE) SOLN   Oral   Take 5 mLs by mouth 4 (four) times daily as needed. Swish and swallow four times a day as needed.   100 mL   0     Bendaryl, Maalox, Nystatin, Lidocaine HCL   . ARIPiprazole (ABILIFY) 20 MG tablet   Oral   Take 20 mg by mouth at bedtime.          Marland Kitchen ibuprofen (ADVIL,MOTRIN) 200 MG tablet   Oral   Take 800 mg by mouth every 8 (eight) hours as needed for pain.          BP 129/70  Pulse 69  Temp(Src) 98.5 F (36.9 C) (Oral)  Resp 16  Ht 5\' 7"  (1.702 m)  Wt 207 lb (93.895 kg)  BMI 32.41 kg/m2  SpO2 100%  LMP 03/05/2013  Physical Exam General: Well-developed, well-nourished female in no acute distress; appearance consistent with age of record HENT: normocephalic; atraumatic Eyes: pupils equal, round and reactive to light; extraocular muscles intact Neck: supple Heart: regular rate and rhythm with sinus arrhythmia Lungs: clear to auscultation bilaterally Abdomen: soft; nondistended; suprapubic tenderness; no masses or hepatosplenomegaly; bowel sounds present GU: Normal external genitalia; no vaginal bleeding; GERD-like vaginal discharge; no cervical motion tenderness; right adnexal tenderness Extremities: No deformity; full range of motion; pulses normal; no edema Neurologic: Awake, alert and oriented; motor function intact in all extremities and symmetric; no facial droop Skin: Warm and dry Psychiatric: Flat affect    ED Course  Procedures (including critical care time)    MDM   Nursing notes and vitals signs, including pulse  oximetry, reviewed.  Summary of this visit's results, reviewed by myself:  Labs:  Results for orders placed during the hospital encounter of 03/22/13 (from the past 24 hour(s))  URINALYSIS, ROUTINE W REFLEX MICROSCOPIC     Status: None   Collection Time    03/22/13 11:55 PM      Result Value Range   Color, Urine YELLOW  YELLOW   APPearance CLEAR  CLEAR   Specific Gravity, Urine 1.022  1.005 - 1.030   pH 7.0  5.0 - 8.0   Glucose, UA NEGATIVE  NEGATIVE mg/dL   Hgb urine dipstick NEGATIVE  NEGATIVE   Bilirubin Urine NEGATIVE  NEGATIVE   Ketones, ur NEGATIVE  NEGATIVE mg/dL   Protein, ur NEGATIVE  NEGATIVE mg/dL   Urobilinogen, UA 1.0  0.0 -  1.0 mg/dL   Nitrite NEGATIVE  NEGATIVE   Leukocytes, UA NEGATIVE  NEGATIVE  PREGNANCY, URINE     Status: None   Collection Time    03/22/13 11:55 PM      Result Value Range   Preg Test, Ur NEGATIVE  NEGATIVE  WET PREP, GENITAL     Status: Abnormal   Collection Time    03/23/13  2:51 AM      Result Value Range   Yeast Wet Prep HPF POC NONE SEEN  NONE SEEN   Trich, Wet Prep NONE SEEN  NONE SEEN   Clue Cells Wet Prep HPF POC FEW (*) NONE SEEN   WBC, Wet Prep HPF POC FEW (*) NONE SEEN   We will have the patient return for a pelvic ultrasound. Examination suspicious for ovarian cyst.      Hanley Seamen, MD 03/23/13 4540

## 2013-03-24 LAB — GC/CHLAMYDIA PROBE AMP: CT Probe RNA: NEGATIVE

## 2013-07-12 ENCOUNTER — Other Ambulatory Visit (HOSPITAL_COMMUNITY)
Admission: RE | Admit: 2013-07-12 | Discharge: 2013-07-12 | Disposition: A | Discharge status: Home / Self Care | Payer: Medicare Other | Source: Ambulatory Visit | Attending: Family Medicine | Admitting: Family Medicine

## 2013-07-12 DIAGNOSIS — R87810 Cervical high risk human papillomavirus (HPV) DNA test positive: Secondary | ICD-10-CM | POA: Insufficient documentation

## 2013-07-12 DIAGNOSIS — Z124 Encounter for screening for malignant neoplasm of cervix: Secondary | ICD-10-CM | POA: Insufficient documentation

## 2013-07-12 DIAGNOSIS — Z1151 Encounter for screening for human papillomavirus (HPV): Secondary | ICD-10-CM | POA: Insufficient documentation

## 2013-07-16 ENCOUNTER — Emergency Department (HOSPITAL_BASED_OUTPATIENT_CLINIC_OR_DEPARTMENT_OTHER)
Admission: EM | Admit: 2013-07-16 | Discharge: 2013-07-16 | Disposition: A | Discharge status: Home / Self Care | Payer: Medicare Other | Attending: Emergency Medicine | Admitting: Emergency Medicine

## 2013-07-16 ENCOUNTER — Encounter (HOSPITAL_BASED_OUTPATIENT_CLINIC_OR_DEPARTMENT_OTHER): Payer: Self-pay | Admitting: Emergency Medicine

## 2013-07-16 DIAGNOSIS — F259 Schizoaffective disorder, unspecified: Secondary | ICD-10-CM | POA: Insufficient documentation

## 2013-07-16 DIAGNOSIS — R350 Frequency of micturition: Secondary | ICD-10-CM | POA: Insufficient documentation

## 2013-07-16 DIAGNOSIS — Z3202 Encounter for pregnancy test, result negative: Secondary | ICD-10-CM | POA: Insufficient documentation

## 2013-07-16 DIAGNOSIS — Z79899 Other long term (current) drug therapy: Secondary | ICD-10-CM | POA: Insufficient documentation

## 2013-07-16 LAB — URINALYSIS, ROUTINE W REFLEX MICROSCOPIC
BILIRUBIN URINE: NEGATIVE
Glucose, UA: NEGATIVE mg/dL
Hgb urine dipstick: NEGATIVE
Ketones, ur: NEGATIVE mg/dL
LEUKOCYTES UA: NEGATIVE
NITRITE: NEGATIVE
PH: 7 (ref 5.0–8.0)
Protein, ur: NEGATIVE mg/dL
SPECIFIC GRAVITY, URINE: 1.002 — AB (ref 1.005–1.030)
Urobilinogen, UA: 0.2 mg/dL (ref 0.0–1.0)

## 2013-07-16 LAB — PREGNANCY, URINE: PREG TEST UR: NEGATIVE

## 2013-07-16 NOTE — Discharge Instructions (Signed)
Keep  your scheduled appointment with Dr.Bland in 2 days

## 2013-07-16 NOTE — ED Notes (Signed)
Urinary frequency x 3 days. Denies dysuria 

## 2013-07-16 NOTE — ED Provider Notes (Signed)
CSN: [id number]     Arrival date & time 07/16/13  1936 History  This chart was scribed for Dorothy SouSam Kiegan Macaraeg, MD by Bennett Scrapehristina Taylor, ED Scribe. This patient was seen in room MH04/MH04 and the patient's care was started at 9:54 PM.   Chief Complaint  Patient presents with  . Urinary Frequency     Patient is a 28 y.o. female presenting with frequency. The history is provided by the patient. No language interpreter was used.  Urinary Frequency This is a new problem. The current episode started more than 2 days ago. The problem occurs constantly. The problem has been gradually improving. Nothing aggravates the symptoms. Nothing relieves the symptoms. She has tried nothing for the symptoms.    HPI Comments: Dorothy Pham is a 28 y.o. female who presents to the Emergency Department complaining of urinary frequency that started 3 days ago. She reports that she was going 3 to 4 times over a few hours but it has slowed down since the onset. She reports nausea earlier tonight but states that this has since resolved. She denies drinking an heavy amount of soda, coffee or tea stating that she mostly drinks water. She denies any prior episodes. She denies any dysuria or vaginal discharge. She has a h/o schizoaffective disorder that is treated with Abilify. She states that she uses albuterol  PRN. She denies smoking, alcohol use or illegal drug use. LNMP  Was 06/29/13.  PCP is Dr. Parke SimmersBland   Past Medical History  Diagnosis Date  . Mental disorder   . Schizoaffective disorder   . Complication of anesthesia   . PONV (postoperative nausea and vomiting)   . Schizoaffective disorder    Past Surgical History  Procedure Laterality Date  . Breast surgery  2012    reduction   Family History  Problem Relation Age of Onset  . Anesthesia problems Neg Hx   . Diabetes Father   . Arthritis Father   . Arthritis Mother    History  Substance Use Topics  . Smoking status: Never Smoker   . Smokeless tobacco: Never  Used  . Alcohol Use: No   OB History   Grav Para Term Preterm Abortions TAB SAB Ect Mult Living   1 0 0 0 0 0 0 0 0 0      Review of Systems  Constitutional: Negative.   HENT: Negative.   Respiratory: Negative.   Cardiovascular: Negative.   Gastrointestinal: Negative.   Genitourinary: Positive for frequency. Negative for dysuria.  Musculoskeletal: Negative.   Skin: Negative.   Neurological: Negative.   Psychiatric/Behavioral: Negative.   All other systems reviewed and are negative.      Allergies  Review of patient's allergies indicates no known allergies.  Home Medications   Current Outpatient Rx  Name  Route  Sig  Dispense  Refill  . albuterol (PROVENTIL HFA;VENTOLIN HFA) 108 (90 BASE) MCG/ACT inhaler   Inhalation   Inhale 2 puffs into the lungs every 6 (six) hours as needed for wheezing.         . ARIPiprazole (ABILIFY) 20 MG tablet   Oral   Take 20 mg by mouth at bedtime.          . metroNIDAZOLE (FLAGYL) 500 MG tablet   Oral   Take 500 mg by mouth 2 (two) times daily.         . Alum & Mag Hydroxide-Simeth (MAGIC MOUTHWASH W/LIDOCAINE) SOLN   Oral   Take 5 mLs by mouth 4 (four) times daily  as needed. Swish and swallow four times a day as needed.   100 mL   0     Bendaryl, Maalox, Nystatin, Lidocaine HCL   . HYDROcodone-acetaminophen (NORCO) 5-325 MG per tablet   Oral   Take 1-2 tablets by mouth every 6 (six) hours as needed (for pain).   6 tablet   0   . ibuprofen (ADVIL,MOTRIN) 200 MG tablet   Oral   Take 800 mg by mouth every 8 (eight) hours as needed for pain.          Triage vitals: BP 135/74  Pulse 84  Temp(Src) 99.5 F (37.5 C) (Oral)  Resp 16  Ht 5' 7.5" (1.715 m)  Wt 213 lb (96.616 kg)  BMI 32.85 kg/m2  SpO2 99%  LMP 06/30/2013  Physical Exam  Nursing note and vitals reviewed. Constitutional: She appears well-developed and well-nourished. No distress.  HENT:  Head: Normocephalic and atraumatic.  Eyes: Conjunctivae are  normal. Pupils are equal, round, and reactive to light.  Neck: Neck supple. No tracheal deviation present. No thyromegaly present.  Cardiovascular: Normal rate and regular rhythm.   No murmur heard. Pulmonary/Chest: Effort normal and breath sounds normal.  Abdominal: Soft. Bowel sounds are normal. She exhibits no distension. There is no tenderness.  Musculoskeletal: Normal range of motion. She exhibits no edema and no tenderness.  Neurological: She is alert. Coordination normal.  Skin: Skin is warm and dry. No rash noted.  Psychiatric: She has a normal mood and affect.    ED Course  Procedures (including critical care time)  DIAGNOSTIC STUDIES: Oxygen Saturation is 99% on RA, normal by my interpretation.    COORDINATION OF CARE:  9:59 PM-Informed pt of normal UA. Discussed discharge plan which includes watching sxs to see if they return with pt and pt agreed to plan. Also advised pt to follow up with PCP as scheduled in 2 days  and pt agreed. Addressed symptoms to return for with pt.   Labs Review Labs Reviewed  URINALYSIS, ROUTINE W REFLEX MICROSCOPIC - Abnormal; Notable for the following:    Specific Gravity, Urine 1.002 (*)    All other components within normal limits  PREGNANCY, URINE   Imaging Review No results found.   EKG Interpretation None      Results for orders placed during the hospital encounter of 07/16/13  URINALYSIS, ROUTINE W REFLEX MICROSCOPIC      Result Value Ref Range   Color, Urine YELLOW  YELLOW   APPearance CLEAR  CLEAR   Specific Gravity, Urine 1.002 (*) 1.005 - 1.030   pH 7.0  5.0 - 8.0   Glucose, UA NEGATIVE  NEGATIVE mg/dL   Hgb urine dipstick NEGATIVE  NEGATIVE   Bilirubin Urine NEGATIVE  NEGATIVE   Ketones, ur NEGATIVE  NEGATIVE mg/dL   Protein, ur NEGATIVE  NEGATIVE mg/dL   Urobilinogen, UA 0.2  0.0 - 1.0 mg/dL   Nitrite NEGATIVE  NEGATIVE   Leukocytes, UA NEGATIVE  NEGATIVE  PREGNANCY, URINE      Result Value Ref Range   Preg Test,  Ur NEGATIVE  NEGATIVE   No results found.   MDM  Symptoms resolving spontaneously. No signs of infection. Denies vaginal discharge or abdominal or pelvic pain. Plan home observation. She can keep her scheduled appointment with Dr. Parke Simmers in 2 days Final diagnoses:  None   Diagnosis urinary frequency   I personally performed the services described in this documentation, which was scribed in my presence. The recorded information has  been reviewed and considered.     Dorothy Sou, MD 07/16/13 2203

## 2013-07-16 NOTE — ED Notes (Signed)
Pt d/c home- will f/u with MD this week

## 2013-09-28 ENCOUNTER — Ambulatory Visit (HOSPITAL_COMMUNITY)
Admission: RE | Admit: 2013-09-28 | Discharge: 2013-09-28 | Disposition: A | Discharge status: Home / Self Care | Payer: Medicare Other | Source: Ambulatory Visit | Attending: Family Medicine | Admitting: Family Medicine

## 2013-09-28 ENCOUNTER — Other Ambulatory Visit (HOSPITAL_COMMUNITY): Payer: Self-pay | Admitting: Family Medicine

## 2013-09-28 DIAGNOSIS — R52 Pain, unspecified: Secondary | ICD-10-CM

## 2013-09-28 DIAGNOSIS — M79609 Pain in unspecified limb: Secondary | ICD-10-CM | POA: Insufficient documentation

## 2013-12-16 ENCOUNTER — Encounter (HOSPITAL_COMMUNITY): Payer: Self-pay | Admitting: Emergency Medicine

## 2013-12-16 ENCOUNTER — Emergency Department (HOSPITAL_COMMUNITY)
Admission: EM | Admit: 2013-12-16 | Discharge: 2013-12-17 | Disposition: A | Discharge status: Home / Self Care | Payer: Medicare Other | Attending: Emergency Medicine | Admitting: Emergency Medicine

## 2013-12-16 ENCOUNTER — Emergency Department (HOSPITAL_COMMUNITY): Payer: Medicare Other

## 2013-12-16 DIAGNOSIS — F259 Schizoaffective disorder, unspecified: Secondary | ICD-10-CM | POA: Insufficient documentation

## 2013-12-16 DIAGNOSIS — Y9229 Other specified public building as the place of occurrence of the external cause: Secondary | ICD-10-CM | POA: Insufficient documentation

## 2013-12-16 DIAGNOSIS — Y9301 Activity, walking, marching and hiking: Secondary | ICD-10-CM | POA: Diagnosis not present

## 2013-12-16 DIAGNOSIS — Z79899 Other long term (current) drug therapy: Secondary | ICD-10-CM | POA: Diagnosis not present

## 2013-12-16 DIAGNOSIS — S199XXA Unspecified injury of neck, initial encounter: Secondary | ICD-10-CM | POA: Diagnosis not present

## 2013-12-16 DIAGNOSIS — IMO0002 Reserved for concepts with insufficient information to code with codable children: Secondary | ICD-10-CM | POA: Insufficient documentation

## 2013-12-16 DIAGNOSIS — M7918 Myalgia, other site: Secondary | ICD-10-CM

## 2013-12-16 DIAGNOSIS — W010XXA Fall on same level from slipping, tripping and stumbling without subsequent striking against object, initial encounter: Secondary | ICD-10-CM | POA: Diagnosis not present

## 2013-12-16 DIAGNOSIS — Z791 Long term (current) use of non-steroidal anti-inflammatories (NSAID): Secondary | ICD-10-CM | POA: Diagnosis not present

## 2013-12-16 DIAGNOSIS — Z3202 Encounter for pregnancy test, result negative: Secondary | ICD-10-CM | POA: Diagnosis not present

## 2013-12-16 DIAGNOSIS — S0993XA Unspecified injury of face, initial encounter: Secondary | ICD-10-CM | POA: Diagnosis not present

## 2013-12-16 LAB — POC URINE PREG, ED: Preg Test, Ur: NEGATIVE

## 2013-12-16 MED ORDER — KETOROLAC TROMETHAMINE 60 MG/2ML IM SOLN
60.0000 mg | Freq: Once | INTRAMUSCULAR | Status: AC
  Administered 2013-12-16: 60 mg via INTRAMUSCULAR
  Filled 2013-12-16: qty 2

## 2013-12-16 NOTE — ED Provider Notes (Signed)
CSN: [id number]     Arrival date & time 12/16/13  2003 History   First MD Initiated Contact with Patient 12/16/13 2148     Chief Complaint  Patient presents with  . Back Pain   28 year old AAF who presents today after fall. Patient was walking out of the grocery store this morning when she slipped on a wet area of pavement, and her legs came out from under her and she fell to her right side. Since that time she's had increasing amounts of pain throughout her whole body, but mostly in her thighs and her back. Pain is an aching sensation throughout, Patient has tried no medications for improvement.  (Consider location/radiation/quality/duration/timing/severity/associated sxs/prior Treatment) Patient is a 28 y.o. female presenting with fall.  Fall This is a new problem. The current episode started today. The problem has been unchanged. Associated symptoms include myalgias (whole body) and neck pain. Pertinent negatives include no abdominal pain, chest pain, chills, coughing, fever, headaches, joint swelling, nausea, rash or vomiting. Nothing aggravates the symptoms. She has tried nothing for the symptoms.    Past Medical History  Diagnosis Date  . Mental disorder   . Schizoaffective disorder   . Complication of anesthesia   . PONV (postoperative nausea and vomiting)   . Schizoaffective disorder    Past Surgical History  Procedure Laterality Date  . Breast surgery  2012    reduction   Family History  Problem Relation Age of Onset  . Anesthesia problems Neg Hx   . Diabetes Father   . Arthritis Father   . Arthritis Mother    History  Substance Use Topics  . Smoking status: Never Smoker   . Smokeless tobacco: Never Used  . Alcohol Use: No   OB History   Grav Para Term Preterm Abortions TAB SAB Ect Mult Living   1 0 0 0 0 0 0 0 0 0      Review of Systems  Constitutional: Negative for fever and chills.  Respiratory: Negative for cough and shortness of breath.   Cardiovascular:  Negative for chest pain, palpitations and leg swelling.  Gastrointestinal: Negative for nausea, vomiting, abdominal pain, diarrhea, constipation and abdominal distention.  Genitourinary: Negative for dysuria, frequency, flank pain and decreased urine volume.  Musculoskeletal: Positive for back pain, myalgias (whole body) and neck pain. Negative for joint swelling.  Skin: Negative for rash.  Neurological: Negative for dizziness, speech difficulty, light-headedness and headaches.  All other systems reviewed and are negative.     Allergies  Review of patient's allergies indicates no known allergies.  Home Medications   Prior to Admission medications   Medication Sig Start Date End Date Taking? Authorizing Provider  albuterol (PROVENTIL HFA;VENTOLIN HFA) 108 (90 BASE) MCG/ACT inhaler Inhale 2 puffs into the lungs every 6 (six) hours as needed for wheezing.   Yes Historical Provider, MD  ARIPiprazole (ABILIFY) 20 MG tablet Take 20 mg by mouth at bedtime.  07/24/11  Yes Historical Provider, MD  ferrous sulfate 325 (65 FE) MG EC tablet Take 325 mg by mouth daily with breakfast.   Yes Historical Provider, MD  ibuprofen (ADVIL,MOTRIN) 200 MG tablet Take 800 mg by mouth every 8 (eight) hours as needed for pain.   Yes Historical Provider, MD   BP 111/55  Pulse 60  Temp(Src) 98.7 F (37.1 C) (Oral)  Resp 18  SpO2 100%  LMP 11/19/2013 Physical Exam  Nursing note and vitals reviewed. Constitutional: She appears well-developed and well-nourished. No distress.  HENT:  Head:  Normocephalic and atraumatic.  Cardiovascular: Normal rate, regular rhythm, normal heart sounds and intact distal pulses.  Exam reveals no gallop and no friction rub.   No murmur heard. Pulmonary/Chest: Effort normal and breath sounds normal. No respiratory distress. She has no wheezes. She has no rales. She exhibits tenderness (posterior right side).  Abdominal: Soft. Bowel sounds are normal. She exhibits no distension and  no mass. There is no tenderness. There is no rebound and no guarding.  Musculoskeletal:       Back:  Lymphadenopathy:    She has no cervical adenopathy.  Skin: Skin is warm and dry. She is not diaphoretic.    ED Course  Procedures (including critical care time) Labs Review Labs Reviewed  POC URINE PREG, ED    Imaging Review Dg Ribs Bilateral W/chest  12/17/2013   CLINICAL DATA:  Trauma, fall, bilateral rib pain  EXAM: BILATERAL RIBS AND CHEST - 4+ VIEW  COMPARISON:  11/10/2012  FINDINGS: No fracture or other bone lesions are seen involving the ribs. There is no evidence of pneumothorax or pleural effusion. Both lungs are clear. Heart size and mediastinal contours are within normal limits.  IMPRESSION: Negative.   Electronically Signed   By: Esperanza Heiraymond  Rubner M.D.   On: 12/17/2013 00:22   Dg Thoracic Spine 2 View  12/17/2013   CLINICAL DATA:  Fall, back pain  EXAM: THORACIC SPINE - 2 VIEW  COMPARISON:  None.  FINDINGS: There is no evidence of thoracic spine fracture. Alignment is normal. No other significant bone abnormalities are identified.  IMPRESSION: Negative.   Electronically Signed   By: Esperanza Heiraymond  Rubner M.D.   On: 12/17/2013 00:23   Dg Lumbar Spine Complete  12/17/2013   CLINICAL DATA:  Fall, pain  EXAM: LUMBAR SPINE - COMPLETE 4+ VIEW  COMPARISON:  None.  FINDINGS: There is no evidence of lumbar spine fracture. Alignment is normal. Intervertebral disc spaces are maintained.  IMPRESSION: Negative.   Electronically Signed   By: Esperanza Heiraymond  Rubner M.D.   On: 12/17/2013 00:22     EKG Interpretation None      MDM   28 year old AASM who presents today after fall. Please see history of present illness for details. On exam patient in NAD, AF VSS. She has tenderness throughout her entire body located in her muscles, including her lower extremities, back, and ribs. Do not suspect any serious injury. However will give Toradol for pain and will obtain x-ray of ribs to ensure no fracture as well  as T and L-spine.  Imaging negative for any acute fracture or other injury. Therefore at this time patient is stable for discharge home. Encouraged her to take Motrin for discomfort every 6 hours as needed and to try heating pads. Patient was given a short course of muscle relaxers for the next couple days. Strict return precautions include any AMS or focal neural deficits.  Final diagnoses:  Musculoskeletal pain    Pt was seen under the supervision of Dr. Deretha EmoryZackowski.     Rachelle HoraKeri Alberta Cairns, MD 12/17/13 [phone]

## 2013-12-16 NOTE — ED Notes (Signed)
[facility] presents [facility] [employer] [facility] today and slipped and fell after stepping in water.  Stated she was wearing flip flops.  States she landed on her right side.  States she hurts in both legs and hurts to move them.

## 2013-12-16 NOTE — ED Notes (Signed)
Presents post fall at 6 AM this morning slipped in water and fell and landed on right hip. Reports that her entire right side hurts and Ibuprofen is not helping. She unable to pinpoint an area that hurts more. Reports inability to walk up steps. Denies hitting head and LOC. Pt is able to move all extremeties. Denies loss of bowel and bladder.

## 2013-12-17 DIAGNOSIS — IMO0002 Reserved for concepts with insufficient information to code with codable children: Secondary | ICD-10-CM | POA: Diagnosis not present

## 2013-12-17 MED ORDER — CYCLOBENZAPRINE HCL 10 MG PO TABS: 10.0000 mg | ORAL_TABLET | Freq: Two times a day (BID) | ORAL | Status: DC | PRN

## 2013-12-17 MED ORDER — CYCLOBENZAPRINE HCL 10 MG PO TABS
5.0000 mg | ORAL_TABLET | Freq: Once | ORAL | Status: AC
  Administered 2013-12-17: 5 mg via ORAL
  Filled 2013-12-17: qty 1

## 2013-12-17 NOTE — ED Provider Notes (Signed)
I saw and evaluated the patient, reviewed the resident's note and I agree with the findings and plan.   EKG Interpretation None      Patient status post a fall this morning at 6 in the morning slipped in the water and fell and landed on her right hip. Patient here complaining tonight of the entire right sided pain. Took ibuprofen not helping. Patient not able to really pinpoint exactly where the pain is coming from seems to be her entire right side. Patient reportedly walk up steps at home. Denied hitting ahead of any loss of consciousness. Patient able to move all 4 extremities on exam. Patient with x-ray evaluation if negative can be discharged home with pain medicine. Patient's past medical history significant for schizoaffective disorder.  Vanetta MuldersScott Saniah Schroeter, MD 12/17/13 0001

## 2013-12-17 NOTE — ED Notes (Signed)
Patient voiced understanding of the discharge instructions.

## 2013-12-17 NOTE — Discharge Instructions (Signed)

## 2013-12-23 ENCOUNTER — Other Ambulatory Visit (HOSPITAL_COMMUNITY): Payer: Self-pay | Admitting: Internal Medicine

## 2013-12-23 ENCOUNTER — Ambulatory Visit (HOSPITAL_COMMUNITY)
Admission: RE | Admit: 2013-12-23 | Discharge: 2013-12-23 | Disposition: A | Discharge status: Home / Self Care | Payer: Medicare Other | Source: Ambulatory Visit | Attending: Internal Medicine | Admitting: Internal Medicine

## 2013-12-23 DIAGNOSIS — M542 Cervicalgia: Secondary | ICD-10-CM

## 2013-12-23 DIAGNOSIS — M25519 Pain in unspecified shoulder: Secondary | ICD-10-CM | POA: Insufficient documentation

## 2013-12-23 DIAGNOSIS — M549 Dorsalgia, unspecified: Secondary | ICD-10-CM | POA: Insufficient documentation

## 2014-01-25 ENCOUNTER — Other Ambulatory Visit: Payer: Self-pay | Admitting: Orthopedic Surgery

## 2014-01-25 DIAGNOSIS — M542 Cervicalgia: Secondary | ICD-10-CM

## 2014-02-02 ENCOUNTER — Ambulatory Visit
Admission: RE | Admit: 2014-02-02 | Discharge: 2014-02-02 | Disposition: A | Discharge status: Home / Self Care | Payer: Medicare Other | Source: Ambulatory Visit | Attending: Orthopedic Surgery | Admitting: Orthopedic Surgery

## 2014-02-02 DIAGNOSIS — M542 Cervicalgia: Secondary | ICD-10-CM

## 2014-03-13 ENCOUNTER — Encounter (HOSPITAL_COMMUNITY): Payer: Self-pay | Admitting: Emergency Medicine

## 2014-05-12 NOTE — L&D Delivery Note (Signed)
Final Labor Progress Note At 1110 pt reports an increased in rectal pressure.  VE C/C/+2.  FHR remained reassuring with occasional early and variable decelerations.  Vaginal Delivery Note The pt utilized an epidural as pain management.   Artificial rupture of membranes today, at 0800, clear.  GBS was negative  Cervical dilation was complete at  1045.  NICHD Category 1-2.    Pushing with guidance began at  1120.   After 1 hour(s) and  33 minutes of pushing the head, shoulders and the body of a viable femal infant "Amira" delivered spontaneously with maternal effort in the ROA position at 1253   With vigorous tone and spontaneous cry, the infant was placed on moms abd.  After the umbilical cord was clamped it was cut by the Grandmother, then cord blood was obtained for evaluation.  Spontaneous delivery of a intact placenta with a 3 vessel cord via [place] at 1259.   Episiotomy: None   The vulva, perineum, vaginal vault, rectum and cervix were inspected  and revealed a right labial superficial laceration degree repaired with a 3-0 vicryl on a SH needle with 3 cc of 1% lidocaine.The rectum sphincter intact after the repair. Patient tolerated repair well.   Postpartum pitocin as ordered.  Fundus firm, lochia minimum, bleeding under control.   EBL 200, Pt hemodynamically stable.   Sponge, laps and needle count correct and verified with the primary care nurse.  Attending MD available at all times.    Routine postpartum orders   Mother desires  OCP for contraception  Mom plans to breastfeed    Placenta to pathology: yes     [facility]  [facility] Cord Gases sent to lab: NO Cord blood sent to lab: YES   APGARS:  8 at 1 minute and 9 at 5 minutes Weight:.  TBD    Both mom and baby were left in stable condition, baby skin to skin.     Dorothy Pham, CNM, MSN 04/16/2015. 1:33 PM

## 2014-06-11 ENCOUNTER — Encounter (HOSPITAL_COMMUNITY): Payer: Self-pay | Admitting: *Deleted

## 2014-06-11 ENCOUNTER — Emergency Department (HOSPITAL_COMMUNITY)
Admission: EM | Admit: 2014-06-11 | Discharge: 2014-06-11 | Disposition: A | Discharge status: Home / Self Care | Payer: Medicare Other | Attending: Emergency Medicine | Admitting: Emergency Medicine

## 2014-06-11 DIAGNOSIS — R05 Cough: Secondary | ICD-10-CM | POA: Diagnosis present

## 2014-06-11 DIAGNOSIS — F259 Schizoaffective disorder, unspecified: Secondary | ICD-10-CM | POA: Diagnosis not present

## 2014-06-11 DIAGNOSIS — F99 Mental disorder, not otherwise specified: Secondary | ICD-10-CM | POA: Insufficient documentation

## 2014-06-11 DIAGNOSIS — Z79899 Other long term (current) drug therapy: Secondary | ICD-10-CM | POA: Insufficient documentation

## 2014-06-11 DIAGNOSIS — R059 Cough, unspecified: Secondary | ICD-10-CM

## 2014-06-11 DIAGNOSIS — J069 Acute upper respiratory infection, unspecified: Secondary | ICD-10-CM | POA: Insufficient documentation

## 2014-06-11 DIAGNOSIS — H9209 Otalgia, unspecified ear: Secondary | ICD-10-CM | POA: Insufficient documentation

## 2014-06-11 LAB — RAPID STREP SCREEN (MED CTR MEBANE ONLY): Streptococcus, Group A Screen (Direct): NEGATIVE

## 2014-06-11 MED ORDER — NAPROXEN 500 MG PO TABS: 500.0000 mg | ORAL_TABLET | Freq: Two times a day (BID) | ORAL | Status: DC

## 2014-06-11 MED ORDER — BENZONATATE 100 MG PO CAPS: 100.0000 mg | ORAL_CAPSULE | Freq: Three times a day (TID) | ORAL | Status: DC

## 2014-06-11 MED ORDER — BENZOCAINE-MENTHOL 10-2.1 MG MT LOZG: 1.0000 | LOZENGE | OROMUCOSAL | Status: DC | PRN

## 2014-06-11 NOTE — ED Notes (Signed)
Declined W/C at D/C and was escorted to lobby by RN. 

## 2014-06-11 NOTE — ED Provider Notes (Signed)
CSN: [id number]     Arrival date & time 06/11/14  4098 History   First MD Initiated Contact with Patient 06/11/14 0840     Chief Complaint  Patient presents with  . URI     (Consider location/radiation/quality/duration/timing/severity/associated sxs/prior Treatment) HPI Comments: Patient presents with one-week history of sore throat, nasal congestion, ear pressure, productive cough of sputum, subjective fevers at home. Patient has been taking over-the-counter cold and flu medications with some relief. She has also been drinking tea with honey to soothe her throat. She denies chest pain or shortness of breath. She states that her mother was ill recently with cold symptoms. She denies wheezing. No other medical complaints at this time.  Patient is a 29 y.o. female presenting with URI. The history is provided by the patient.  URI Presenting symptoms: congestion, cough, ear pain, fever (Subjective), rhinorrhea and sore throat   Presenting symptoms: no fatigue   Associated symptoms: no headaches, no myalgias and no wheezing     Past Medical History  Diagnosis Date  . Mental disorder   . Schizoaffective disorder   . Complication of anesthesia   . PONV (postoperative nausea and vomiting)   . Schizoaffective disorder    Past Surgical History  Procedure Laterality Date  . Breast surgery  2012    reduction   Family History  Problem Relation Age of Onset  . Anesthesia problems Neg Hx   . Diabetes Father   . Arthritis Father   . Arthritis Mother    History  Substance Use Topics  . Smoking status: Never Smoker   . Smokeless tobacco: Never Used  . Alcohol Use: No   OB History    Gravida Para Term Preterm AB TAB SAB Ectopic Multiple Living       Review of Systems  Constitutional: Positive for fever (Subjective). Negative for chills and fatigue.  HENT: Positive for congestion, ear discharge, ear pain, rhinorrhea and sore throat. Negative for sinus pressure.    Eyes: Negative for redness.  Respiratory: Positive for cough. Negative for wheezing.   Gastrointestinal: Negative for nausea, vomiting, abdominal pain and diarrhea.  Genitourinary: Negative for dysuria.  Musculoskeletal: Negative for myalgias and neck stiffness.  Skin: Negative for rash.  Neurological: Negative for headaches.  Hematological: Negative for adenopathy.      Allergies  Review of patient's allergies indicates no known allergies.  Home Medications   Prior to Admission medications   Medication Sig Start Date End Date Taking? Authorizing Provider  albuterol (PROVENTIL HFA;VENTOLIN HFA) 108 (90 BASE) MCG/ACT inhaler Inhale 2 puffs into the lungs every 6 (six) hours as needed for wheezing.   Yes Historical Provider, MD  ARIPiprazole (ABILIFY) 20 MG tablet Take 20 mg by mouth at bedtime.  07/24/11  Yes Historical Provider, MD  cyclobenzaprine (FLEXERIL) 10 MG tablet Take 1 tablet (10 mg total) by mouth 2 (two) times daily as needed for muscle spasms. 12/17/13  Yes Rachelle Hora, MD  ferrous sulfate 325 (65 FE) MG EC tablet Take 325 mg by mouth daily with breakfast.   Yes Historical Provider, MD  ibuprofen (ADVIL,MOTRIN) 200 MG tablet Take 800 mg by mouth every 8 (eight) hours as needed for pain.   Yes Historical Provider, MD   BP 127/84 mmHg  Pulse 91  Temp(Src) 98.5 F (36.9 C) (Oral)  Resp 18  Ht 5' 7.5" (1.715 m)  Wt 222 lb (100.699 kg)  BMI 34.24 kg/m2  SpO2 100%  LMP 05/21/2014 Physical Exam  Constitutional: She appears well-developed and well-nourished.  HENT:  Head: Normocephalic and atraumatic.  Right Ear: Tympanic membrane, external ear and ear canal normal.  Left Ear: Tympanic membrane, external ear and ear canal normal.  Nose: Mucosal edema present. No rhinorrhea.  Mouth/Throat: Uvula is midline and mucous membranes are normal. Mucous membranes are not dry. No oral lesions. No trismus in the jaw. No uvula swelling. Posterior oropharyngeal erythema present. No  oropharyngeal exudate, posterior oropharyngeal edema or tonsillar abscesses.  Eyes: Conjunctivae are normal. Right eye exhibits no discharge. Left eye exhibits no discharge.  Neck: Normal range of motion. Neck supple.  Cardiovascular: Normal rate, regular rhythm and normal heart sounds.   Pulmonary/Chest: Effort normal and breath sounds normal. No respiratory distress. She has no wheezes. She has no rales.  Abdominal: Soft. There is no tenderness.  Lymphadenopathy:    She has no cervical adenopathy.  Neurological: She is alert.  Skin: Skin is warm and dry.  Psychiatric: She has a normal mood and affect.  Nursing note and vitals reviewed.   ED Course  Procedures (including critical care time) Labs Review Labs Reviewed  RAPID STREP SCREEN  CULTURE, GROUP A STREP    Imaging Review No results found.   EKG Interpretation None       9:23 AM Patient seen and examined. Work-up initiated.    Vital signs reviewed and are as follows: BP 127/84 mmHg  Pulse 91  Temp(Src) 98.5 F (36.9 C) (Oral)  Resp 18  Ht 5' 7.5" (1.715 m)  Wt 222 lb (100.699 kg)  BMI 34.24 kg/m2  SpO2 100%  LMP 05/21/2014  Strep negative. Patient informed. Will discharge to home with Tessalon, Cepacol, and NSAID.  Patient counseled on supportive care for viral URI and s/s to return including worsening symptoms, persistent fever, persistent vomiting, or if they have any other concerns. Urged to see PCP if symptoms persist for more than 3 days. Patient verbalizes understanding and agrees with plan.    MDM   Final diagnoses:  Upper respiratory tract infection  Cough   Patient with symptoms consistent with a viral syndrome. Vitals are stable, no fever. No signs of dehydration. Lung exam normal, no signs of pneumonia. Supportive therapy indicated with return if symptoms worsen.       Renne CriglerJoshua Tahjae Durr, PA-C 06/11/14 [id number]  Gerhard Munchobert Lockwood, MD 06/11/14 [phone]

## 2014-06-11 NOTE — ED Notes (Signed)
Pt reports Sx's  Started 1 week ago with a sore throat . Pt reports Sx's have progressed to runny nose ,conjestion ,cough and pressure in ears. Pt also reports fevers at home.

## 2014-06-11 NOTE — Discharge Instructions (Signed)
Please read and follow all provided instructions.  Your diagnoses today include:  1. Upper respiratory tract infection   2. Cough     You appear to have an upper respiratory infection (URI). An upper respiratory tract infection, or cold, is a viral infection of the air passages leading to the lungs. It should improve gradually after 5-7 days. You may have a lingering cough that lasts for 2- 4 weeks after the infection.  Tests performed today include:  Vital signs. See below for your results today.   Strep test - negative  Medications prescribed:   Tessalon Perles - cough suppressant medication   Naproxen - anti-inflammatory pain medication  Do not exceed 500mg  naproxen every 12 hours, take with food  You have been prescribed an anti-inflammatory medication or NSAID. Take with food. Take smallest effective dose for the shortest duration needed for your pain. Stop taking if you experience stomach pain or vomiting.   Take any prescribed medications only as directed. Treatment for your infection is aimed at treating the symptoms. There are no medications, such as antibiotics, that will cure your infection.   Home care instructions:  Follow any educational materials contained in this packet.   Your illness is contagious and can be spread to others, especially during the first 3 or 4 days. It cannot be cured by antibiotics or other medicines. Take basic precautions such as washing your hands often, covering your mouth when you cough or sneeze, and avoiding public places where you could spread your illness to others.   Please continue drinking plenty of fluids.  Use over-the-counter medicines as needed as directed on packaging for symptom relief.  You may also use ibuprofen or tylenol as directed on packaging for pain or fever.  Do not take multiple medicines containing Tylenol or acetaminophen to avoid taking too much of this medication.  Follow-up instructions: Please follow-up with  your primary care provider in the next 3 days for further evaluation of your symptoms if you are not feeling better.   Return instructions:   Please return to the Emergency Department if you experience worsening symptoms.   RETURN IMMEDIATELY IF you develop shortness of breath, confusion or altered mental status, a new rash, become dizzy, faint, or poorly responsive, or are unable to be cared for at home.  Please return if you have persistent vomiting and cannot keep down fluids or develop a fever that is not controlled by tylenol or motrin.    Please return if you have any other emergent concerns.  Additional Information:  Your vital signs today were: BP 127/84 mmHg   Pulse 91   Temp(Src) 98.5 F (36.9 C) (Oral)   Resp 18   Ht 5' 7.5" (1.715 m)   Wt 222 lb (100.699 kg)   BMI 34.24 kg/m2   SpO2 100%   LMP 05/21/2014 If your blood pressure (BP) was elevated above 135/85 this visit, please have this repeated by your doctor within one month. --------------

## 2014-06-13 LAB — CULTURE, GROUP A STREP

## 2014-08-01 ENCOUNTER — Encounter (HOSPITAL_BASED_OUTPATIENT_CLINIC_OR_DEPARTMENT_OTHER): Payer: Self-pay | Admitting: *Deleted

## 2014-08-01 ENCOUNTER — Emergency Department (HOSPITAL_BASED_OUTPATIENT_CLINIC_OR_DEPARTMENT_OTHER)
Admission: EM | Admit: 2014-08-01 | Discharge: 2014-08-02 | Discharge status: Against Medical Advice | Payer: Medicare Other | Attending: Emergency Medicine | Admitting: Emergency Medicine

## 2014-08-01 DIAGNOSIS — R110 Nausea: Secondary | ICD-10-CM | POA: Diagnosis present

## 2014-08-01 DIAGNOSIS — Z3202 Encounter for pregnancy test, result negative: Secondary | ICD-10-CM | POA: Diagnosis not present

## 2014-08-01 LAB — URINALYSIS, ROUTINE W REFLEX MICROSCOPIC
Bilirubin Urine: NEGATIVE
GLUCOSE, UA: NEGATIVE mg/dL
Hgb urine dipstick: NEGATIVE
Ketones, ur: NEGATIVE mg/dL
Leukocytes, UA: NEGATIVE
NITRITE: NEGATIVE
Protein, ur: NEGATIVE mg/dL
Specific Gravity, Urine: 1.03 (ref 1.005–1.030)
UROBILINOGEN UA: 1 mg/dL (ref 0.0–1.0)
pH: 6.5 (ref 5.0–8.0)

## 2014-08-01 LAB — PREGNANCY, URINE: Preg Test, Ur: NEGATIVE

## 2014-08-01 NOTE — ED Notes (Signed)
Pt c/o "woozy" feeling in her abd since yesterday. When asked if it is pain or nausea, she sts "it's just woozy."

## 2014-08-02 ENCOUNTER — Encounter (HOSPITAL_COMMUNITY): Payer: Self-pay | Admitting: Emergency Medicine

## 2014-08-02 ENCOUNTER — Emergency Department (HOSPITAL_COMMUNITY)
Admission: EM | Admit: 2014-08-02 | Discharge: 2014-08-03 | Disposition: A | Discharge status: Home / Self Care | Payer: Medicare Other | Attending: Emergency Medicine | Admitting: Emergency Medicine

## 2014-08-02 DIAGNOSIS — Z3202 Encounter for pregnancy test, result negative: Secondary | ICD-10-CM | POA: Insufficient documentation

## 2014-08-02 DIAGNOSIS — Z79899 Other long term (current) drug therapy: Secondary | ICD-10-CM | POA: Insufficient documentation

## 2014-08-02 DIAGNOSIS — Z791 Long term (current) use of non-steroidal anti-inflammatories (NSAID): Secondary | ICD-10-CM | POA: Insufficient documentation

## 2014-08-02 DIAGNOSIS — R110 Nausea: Secondary | ICD-10-CM | POA: Insufficient documentation

## 2014-08-02 DIAGNOSIS — F259 Schizoaffective disorder, unspecified: Secondary | ICD-10-CM | POA: Diagnosis not present

## 2014-08-02 MED ORDER — ONDANSETRON 4 MG PO TBDP
ORAL_TABLET | ORAL | Status: AC
  Filled 2014-08-02: qty 1

## 2014-08-02 MED ORDER — ONDANSETRON 4 MG PO TBDP
4.0000 mg | ORAL_TABLET | Freq: Once | ORAL | Status: AC
  Administered 2014-08-02: 4 mg via ORAL

## 2014-08-02 NOTE — ED Notes (Signed)
Patient here with complaint of abdomen feeling "woozy", and nausea. States that she had negative home pregnancy tests. Denies vomiting, and fevers. States abdomen does not hurt.

## 2014-08-02 NOTE — ED Provider Notes (Signed)
CSN: [id number]     Arrival date & time 08/02/14  2317 History  This chart was scribed for Marisa Severin, MD by Bronson Curb, ED Scribe. This patient was seen in room B18C/B18C and the patient's care was started at 12:18 AM.    Chief Complaint  Patient presents with  . Nausea    The history is provided by the patient. No language interpreter was used.     HPI Comments: Dorothy Pham is a 29 y.o. female who presents to the Emergency Department complaining of constant generalized nausea for the past 2 days. Patient notes her abdomen "feels weird".  She denies eating unfamiliar foods. She denies vomiting, diarrhea, vaginal discharge, urinary symptoms. LNMP earlier this month.   Past Medical History  Diagnosis Date  . Mental disorder   . Schizoaffective disorder   . Complication of anesthesia   . PONV (postoperative nausea and vomiting)   . Schizoaffective disorder    Past Surgical History  Procedure Laterality Date  . Breast surgery  2012    reduction   Family History  Problem Relation Age of Onset  . Anesthesia problems Neg Hx   . Diabetes Father   . Arthritis Father   . Arthritis Mother    History  Substance Use Topics  . Smoking status: Never Smoker   . Smokeless tobacco: Never Used  . Alcohol Use: No   OB History    Gravida Para Term Preterm AB TAB SAB Ectopic Multiple Living       Review of Systems  Gastrointestinal: Positive for nausea and abdominal pain.  All other systems reviewed and are negative.     Allergies  Review of patient's allergies indicates no known allergies.  Home Medications   Prior to Admission medications   Medication Sig Start Date End Date Taking? Authorizing Provider  albuterol (PROVENTIL HFA;VENTOLIN HFA) 108 (90 BASE) MCG/ACT inhaler Inhale 2 puffs into the lungs every 6 (six) hours as needed for wheezing.    Historical Provider, MD  ARIPiprazole (ABILIFY) 20 MG tablet Take 20 mg by mouth at bedtime.  07/24/11    Historical Provider, MD  Benzocaine-Menthol 10-2.1 MG LOZG Use as directed 1 lozenge in the mouth or throat as needed. 06/11/14   Renne Crigler, PA-C  benzonatate (TESSALON) 100 MG capsule Take 1 capsule (100 mg total) by mouth every 8 (eight) hours. 06/11/14   Renne Crigler, PA-C  cyclobenzaprine (FLEXERIL) 10 MG tablet Take 1 tablet (10 mg total) by mouth 2 (two) times daily as needed for muscle spasms. 12/17/13   Rachelle Hora, MD  ferrous sulfate 325 (65 FE) MG EC tablet Take 325 mg by mouth daily with breakfast.    Historical Provider, MD  ibuprofen (ADVIL,MOTRIN) 200 MG tablet Take 800 mg by mouth every 8 (eight) hours as needed for pain.    Historical Provider, MD  naproxen (NAPROSYN) 500 MG tablet Take 1 tablet (500 mg total) by mouth 2 (two) times daily. 06/11/14   Renne Crigler, PA-C  phentermine 15 MG capsule Take 15 mg by mouth every morning.    Historical Provider, MD   Triage Vitals: BP 122/79 mmHg  Pulse 93  Temp(Src) 97.8 F (36.6 C) (Oral)  Resp 16  SpO2 100%  LMP 07/16/2014 (Exact Date)  Physical Exam  Constitutional: She is oriented to person, place, and time. She appears well-developed and well-nourished. No distress.  HENT:  Head: Normocephalic and atraumatic.  Nose: Nose normal.  Mouth/Throat: Oropharynx is clear and moist.  Eyes: Conjunctivae and EOM are normal. Pupils are equal, round, and reactive to light.  Neck: Normal range of motion. Neck supple. No JVD present. No tracheal deviation present. No thyromegaly present.  Cardiovascular: Normal rate, regular rhythm, normal heart sounds and intact distal pulses.  Exam reveals no gallop and no friction rub.   No murmur heard. Pulmonary/Chest: Effort normal and breath sounds normal. No stridor. No respiratory distress. She has no wheezes. She has no rales. She exhibits no tenderness.  Abdominal: Soft. Bowel sounds are normal. She exhibits no distension and no mass. There is no tenderness. There is no rebound and no  guarding.  Musculoskeletal: Normal range of motion. She exhibits no edema or tenderness.  Lymphadenopathy:    She has no cervical adenopathy.  Neurological: She is alert and oriented to person, place, and time. She displays normal reflexes. She exhibits normal muscle tone. Coordination normal.  Skin: Skin is warm and dry. No rash noted. No erythema. No pallor.  Psychiatric: She has a normal mood and affect. Her behavior is normal. Judgment and thought content normal.  Nursing note and vitals reviewed.   ED Course  Procedures (including critical care time)  DIAGNOSTIC STUDIES: Oxygen Saturation is 100% on room air, normal by my interpretation.    COORDINATION OF CARE: At 0020 Discussed treatment plan with patient which includes antiemetic. Patient agrees.   Labs Review Labs Reviewed  CBC WITH DIFFERENTIAL/PLATELET - Abnormal; Notable for the following:    RBC 3.85 (*)    Hemoglobin 11.9 (*)    All other components within normal limits  COMPREHENSIVE METABOLIC PANEL - Abnormal; Notable for the following:    Glucose, Bld 136 (*)    GFR calc non Af Amer 76 (*)    GFR calc Af Amer 88 (*)    Anion gap 4 (*)    All other components within normal limits  URINALYSIS, ROUTINE W REFLEX MICROSCOPIC - Abnormal; Notable for the following:    APPearance CLOUDY (*)    Specific Gravity, Urine 1.031 (*)    All other components within normal limits  LIPASE, BLOOD  POC URINE PREG, ED    Imaging Review No results found.   EKG Interpretation None      MDM   Final diagnoses:  Nausea   29 year old female with complaint of nausea since yesterday.  No abdominal pain, no urinary symptoms.  No vaginal discharge, no vomiting, no unusual foods no sick contacts.  Patient has benign exam.  Patient is not pregnant.  Labs unremarkable.  She is stable for discharge home to follow-up with primary care doctor, will discharge home with Zofran   I personally performed the services described in this  documentation, which was scribed in my presence. The recorded information has been reviewed and is accurate.     Marisa Severinlga Matrice Herro, MD 08/03/14 [phone]

## 2014-08-03 DIAGNOSIS — R110 Nausea: Secondary | ICD-10-CM | POA: Diagnosis not present

## 2014-08-03 LAB — CBC WITH DIFFERENTIAL/PLATELET
Basophils Absolute: 0 10*3/uL (ref 0.0–0.1)
Basophils Relative: 0 % (ref 0–1)
Eosinophils Absolute: 0 10*3/uL (ref 0.0–0.7)
Eosinophils Relative: 0 % (ref 0–5)
HCT: 36.2 % (ref 36.0–46.0)
Hemoglobin: 11.9 g/dL — ABNORMAL LOW (ref 12.0–15.0)
Lymphocytes Relative: 40 % (ref 12–46)
Lymphs Abs: 2.8 10*3/uL (ref 0.7–4.0)
MCH: 30.9 pg (ref 26.0–34.0)
MCHC: 32.9 g/dL (ref 30.0–36.0)
MCV: 94 fL (ref 78.0–100.0)
MONO ABS: 0.3 10*3/uL (ref 0.1–1.0)
MONOS PCT: 5 % (ref 3–12)
NEUTROS ABS: 3.9 10*3/uL (ref 1.7–7.7)
Neutrophils Relative %: 55 % (ref 43–77)
Platelets: 305 10*3/uL (ref 150–400)
RBC: 3.85 MIL/uL — ABNORMAL LOW (ref 3.87–5.11)
RDW: 13.1 % (ref 11.5–15.5)
WBC: 7.1 10*3/uL (ref 4.0–10.5)

## 2014-08-03 LAB — LIPASE, BLOOD: Lipase: 25 U/L (ref 11–59)

## 2014-08-03 LAB — URINALYSIS, ROUTINE W REFLEX MICROSCOPIC
Bilirubin Urine: NEGATIVE
GLUCOSE, UA: NEGATIVE mg/dL
Hgb urine dipstick: NEGATIVE
Ketones, ur: NEGATIVE mg/dL
LEUKOCYTES UA: NEGATIVE
NITRITE: NEGATIVE
PH: 5.5 (ref 5.0–8.0)
Protein, ur: NEGATIVE mg/dL
Specific Gravity, Urine: 1.031 — ABNORMAL HIGH (ref 1.005–1.030)
Urobilinogen, UA: 0.2 mg/dL (ref 0.0–1.0)

## 2014-08-03 LAB — COMPREHENSIVE METABOLIC PANEL
ALT: 17 U/L (ref 0–35)
ANION GAP: 4 — AB (ref 5–15)
AST: 20 U/L (ref 0–37)
Albumin: 4.2 g/dL (ref 3.5–5.2)
Alkaline Phosphatase: 58 U/L (ref 39–117)
BUN: 16 mg/dL (ref 6–23)
CALCIUM: 9.8 mg/dL (ref 8.4–10.5)
CO2: 29 mmol/L (ref 19–32)
CREATININE: 1.01 mg/dL (ref 0.50–1.10)
Chloride: 105 mmol/L (ref 96–112)
GFR calc non Af Amer: 76 mL/min — ABNORMAL LOW (ref >90)
GFR, EST AFRICAN AMERICAN: 88 mL/min — AB (ref >90)
GLUCOSE: 136 mg/dL — AB (ref 70–99)
Potassium: 4.8 mmol/L (ref 3.5–5.1)
Sodium: 138 mmol/L (ref 135–145)
TOTAL PROTEIN: 8.2 g/dL (ref 6.0–8.3)
Total Bilirubin: 0.5 mg/dL (ref 0.3–1.2)

## 2014-08-03 LAB — POC URINE PREG, ED: Preg Test, Ur: NEGATIVE

## 2014-08-03 MED ORDER — ONDANSETRON 4 MG PO TBDP
8.0000 mg | ORAL_TABLET | Freq: Once | ORAL | Status: AC
  Administered 2014-08-03: 8 mg via ORAL
  Filled 2014-08-03: qty 2

## 2014-08-03 MED ORDER — ONDANSETRON 8 MG PO TBDP: 8.0000 mg | ORAL_TABLET | Freq: Three times a day (TID) | ORAL | Status: DC | PRN

## 2014-08-03 NOTE — ED Notes (Signed)
Offered nausea medication, ginger ale and saltine crackers. Patient declines all at this time for nausea. No vomiting.

## 2014-08-03 NOTE — Discharge Instructions (Signed)
Your workup today has not shown a specific cause for your nausea.  You are not pregnant.  Your blood sugar was slightly elevated at 136.  Follow-up with your primary care doctor for further workup and recheck of your blood sugar in 1-2 weeks   Nausea, Adult Nausea is the feeling that you have an upset stomach or have to vomit. Nausea by itself is not likely a serious concern, but it may be an early sign of more serious medical problems. As nausea gets worse, it can lead to vomiting. If vomiting develops, there is the risk of dehydration.  CAUSES   Viral infections.  Food poisoning.  Medicines.  Pregnancy.  Motion sickness.  Migraine headaches.  Emotional distress.  Severe pain from any source.  Alcohol intoxication. HOME CARE INSTRUCTIONS  Get plenty of rest.  Ask your caregiver about specific rehydration instructions.  Eat small amounts of food and sip liquids more often.  Take all medicines as told by your caregiver. SEEK MEDICAL CARE IF:  You have not improved after 2 days, or you get worse.  You have a headache. SEEK IMMEDIATE MEDICAL CARE IF:   You have a fever.  You faint.  You keep vomiting or have blood in your vomit.  You are extremely weak or dehydrated.  You have dark or bloody stools.  You have severe chest or abdominal pain. MAKE SURE YOU:  Understand these instructions.  Will watch your condition.  Will get help right away if you are not doing well or get worse. Document Released: 06/05/2004 Document Revised: 01/21/2012 Document Reviewed: 01/08/2011 [facility] Patient Information 2015 [employer], [place]. This information is not intended to replace advice given to you by your health care provider. Make sure you discuss any questions you have with your health care provider.

## 2014-08-10 LAB — OB RESULTS CONSOLE HIV ANTIBODY (ROUTINE TESTING): HIV: NONREACTIVE

## 2014-08-10 LAB — OB RESULTS CONSOLE ABO/RH: RH TYPE: POSITIVE

## 2014-08-10 LAB — OB RESULTS CONSOLE HEPATITIS B SURFACE ANTIGEN: HEP B S AG: NEGATIVE

## 2014-08-10 LAB — OB RESULTS CONSOLE RUBELLA ANTIBODY, IGM: Rubella: IMMUNE

## 2014-08-10 LAB — OB RESULTS CONSOLE GC/CHLAMYDIA
Chlamydia: NEGATIVE
Gonorrhea: NEGATIVE

## 2014-08-10 LAB — OB RESULTS CONSOLE ANTIBODY SCREEN: ANTIBODY SCREEN: NEGATIVE

## 2014-08-10 LAB — OB RESULTS CONSOLE RPR: RPR: NONREACTIVE

## 2014-10-29 ENCOUNTER — Emergency Department (HOSPITAL_COMMUNITY)
Admission: EM | Admit: 2014-10-29 | Discharge: 2014-10-30 | Disposition: A | Discharge status: Home / Self Care | Payer: Medicare Other | Attending: Emergency Medicine | Admitting: Emergency Medicine

## 2014-10-29 ENCOUNTER — Encounter (HOSPITAL_COMMUNITY): Payer: Self-pay | Admitting: *Deleted

## 2014-10-29 DIAGNOSIS — O23592 Infection of other part of genital tract in pregnancy, second trimester: Secondary | ICD-10-CM | POA: Insufficient documentation

## 2014-10-29 DIAGNOSIS — Z8659 Personal history of other mental and behavioral disorders: Secondary | ICD-10-CM | POA: Insufficient documentation

## 2014-10-29 DIAGNOSIS — Z3A15 15 weeks gestation of pregnancy: Secondary | ICD-10-CM | POA: Diagnosis not present

## 2014-10-29 DIAGNOSIS — Z79899 Other long term (current) drug therapy: Secondary | ICD-10-CM | POA: Diagnosis not present

## 2014-10-29 DIAGNOSIS — Z791 Long term (current) use of non-steroidal anti-inflammatories (NSAID): Secondary | ICD-10-CM | POA: Insufficient documentation

## 2014-10-29 DIAGNOSIS — N760 Acute vaginitis: Secondary | ICD-10-CM

## 2014-10-29 NOTE — ED Notes (Addendum)
Pt c/o vaginal pain, irritation and discharge x 1 week. Pt reports that she is [redacted] weeks pregnant, LMP 3/6.

## 2014-10-30 DIAGNOSIS — O23592 Infection of other part of genital tract in pregnancy, second trimester: Secondary | ICD-10-CM | POA: Diagnosis not present

## 2014-10-30 LAB — WET PREP, GENITAL
Clue Cells Wet Prep HPF POC: NONE SEEN
Trich, Wet Prep: NONE SEEN
YEAST WET PREP: NONE SEEN

## 2014-10-30 LAB — URINALYSIS, ROUTINE W REFLEX MICROSCOPIC
Bilirubin Urine: NEGATIVE
Glucose, UA: NEGATIVE mg/dL
Hgb urine dipstick: NEGATIVE
Ketones, ur: NEGATIVE mg/dL
LEUKOCYTES UA: NEGATIVE
NITRITE: NEGATIVE
PROTEIN: NEGATIVE mg/dL
Specific Gravity, Urine: 1.026 (ref 1.005–1.030)
UROBILINOGEN UA: 0.2 mg/dL (ref 0.0–1.0)
pH: 5.5 (ref 5.0–8.0)

## 2014-10-30 LAB — GC/CHLAMYDIA PROBE AMP (~~LOC~~) NOT AT ARMC
CHLAMYDIA, DNA PROBE: NEGATIVE
NEISSERIA GONORRHEA: NEGATIVE

## 2014-10-30 LAB — POC URINE PREG, ED: Preg Test, Ur: POSITIVE — AB

## 2014-10-30 MED ORDER — ACETAMINOPHEN 500 MG PO TABS
1000.0000 mg | ORAL_TABLET | Freq: Once | ORAL | Status: AC
  Administered 2014-10-30: 1000 mg via ORAL
  Filled 2014-10-30: qty 2

## 2014-10-30 NOTE — Discharge Instructions (Signed)
We will call you if your cultures indicate you require further treatment.  Return to the emergency department if symptoms significantly worsen or change.   Vaginitis Vaginitis is an inflammation of the vagina. It is most often caused by a change in the normal balance of the bacteria and yeast that live in the vagina. This change in balance causes an overgrowth of certain bacteria or yeast, which causes the inflammation. There are different types of vaginitis, but the most common types are:  Bacterial vaginosis.  Yeast infection (candidiasis).  Trichomoniasis vaginitis. This is a sexually transmitted infection (STI).  Viral vaginitis.  Atropic vaginitis.  Allergic vaginitis. CAUSES  The cause depends on the type of vaginitis. Vaginitis can be caused by:  Bacteria (bacterial vaginosis).  Yeast (yeast infection).  A parasite (trichomoniasis vaginitis)  A virus (viral vaginitis).  Low hormone levels (atrophic vaginitis). Low hormone levels can occur during pregnancy, breastfeeding, or after menopause.  Irritants, such as bubble baths, scented tampons, and feminine sprays (allergic vaginitis). Other factors can change the normal balance of the yeast and bacteria that live in the vagina. These include:  Antibiotic medicines.  Poor hygiene.  Diaphragms, vaginal sponges, spermicides, birth control pills, and intrauterine devices (IUD).  Sexual intercourse.  Infection.  Uncontrolled diabetes.  A weakened immune system. SYMPTOMS  Symptoms can vary depending on the cause of the vaginitis. Common symptoms include:  Abnormal vaginal discharge.  The discharge is white, gray, or yellow with bacterial vaginosis.  The discharge is thick, white, and cheesy with a yeast infection.  The discharge is frothy and yellow or greenish with trichomoniasis.  A bad vaginal odor.  The odor is fishy with bacterial vaginosis.  Vaginal itching, pain, or swelling.  Painful  intercourse.  Pain or burning when urinating. Sometimes, there are no symptoms. TREATMENT  Treatment will vary depending on the type of infection.   Bacterial vaginosis and trichomoniasis are often treated with antibiotic creams or pills.  Yeast infections are often treated with antifungal medicines, such as vaginal creams or suppositories.  Viral vaginitis has no cure, but symptoms can be treated with medicines that relieve discomfort. Your sexual partner should be treated as well.  Atrophic vaginitis may be treated with an estrogen cream, pill, suppository, or vaginal ring. If vaginal dryness occurs, lubricants and moisturizing creams may help. You may be told to avoid scented soaps, sprays, or douches.  Allergic vaginitis treatment involves quitting the use of the product that is causing the problem. Vaginal creams can be used to treat the symptoms. HOME CARE INSTRUCTIONS   Take all medicines as directed by your caregiver.  Keep your genital area clean and dry. Avoid soap and only rinse the area with water.  Avoid douching. It can remove the healthy bacteria in the vagina.  Do not use tampons or have sexual intercourse until your vaginitis has been treated. Use sanitary pads while you have vaginitis.  Wipe from front to back. This avoids the spread of bacteria from the rectum to the vagina.  Let air reach your genital area.  Wear cotton underwear to decrease moisture buildup.  Avoid wearing underwear while you sleep until your vaginitis is gone.  Avoid tight pants and underwear or nylons without a cotton panel.  Take off wet clothing (especially bathing suits) as soon as possible.  Use mild, non-scented products. Avoid using irritants, such as:  Scented feminine sprays.  Fabric softeners.  Scented detergents.  Scented tampons.  Scented soaps or bubble baths.  Practice safe  sex and use condoms. Condoms may prevent the spread of trichomoniasis and viral  vaginitis. SEEK MEDICAL CARE IF:   You have abdominal pain.  You have a fever or persistent symptoms for more than 2-3 days.  You have a fever and your symptoms suddenly get worse. Document Released: 02/23/2007 Document Revised: 01/21/2012 Document Reviewed: 10/09/2011 [facility] Patient Information 2015 [place], [place]. This information is not intended to replace advice given to you by your health care provider. Make sure you discuss any questions you have with your health care provider.

## 2014-10-30 NOTE — ED Notes (Signed)
MD at bedside. 

## 2014-10-30 NOTE — ED Provider Notes (Signed)
CSN: [id number]     Arrival date & time 10/29/14  2329 History   First MD Initiated Contact with Patient 10/30/14 0007     This chart was scribed for Geoffery Lyons, MD by [name], ED Scribe. This patient was seen in room D35C/D35C and the patient's care was started 12:09 AM.   No chief complaint on file.  The history is provided by the patient. No language interpreter was used.    HPI Comments: Dorothy Pham G2P0000 and [redacted] weeks gestation a 29 y.o. female who presents to the Emergency Department complaining of constant, ongoing, unchanged lower abdominal pain x 1 week. Pain is exacerbated with palpation without any alleviating factors at this time. Pt also reports vaginal discharge and vaginal pain described as "irritaiton". Pt was evaluated 2 weeks ago for same but states no abnormal findings found on examination. She denies any new sexual partners. No recent fever, chills, nausea, vomiting, or diarrhea. LNMP 3/6. No known allergies to medications.  Past Medical History  Diagnosis Date  . Mental disorder   . Schizoaffective disorder   . Complication of anesthesia   . PONV (postoperative nausea and vomiting)   . Schizoaffective disorder    Past Surgical History  Procedure Laterality Date  . Breast surgery  2012    reduction   Family History  Problem Relation Age of Onset  . Anesthesia problems Neg Hx   . Diabetes Father   . Arthritis Father   . Arthritis Mother    History  Substance Use Topics  . Smoking status: Never Smoker   . Smokeless tobacco: Never Used  . Alcohol Use: No   OB History    Gravida Para Term Preterm AB TAB SAB Ectopic Multiple Living   2 0 0 0 0 0 0 0 0 0      Review of Systems  Constitutional: Negative for fever and chills.  Respiratory: Negative for shortness of breath.   Cardiovascular: Negative for chest pain.  Gastrointestinal: Negative for nausea, vomiting, abdominal pain and diarrhea.  Genitourinary: Positive for vaginal discharge and  vaginal pain. Negative for vaginal bleeding.  All other systems reviewed and are negative.     Allergies  Review of patient's allergies indicates no known allergies.  Home Medications   Prior to Admission medications   Medication Sig Start Date End Date Taking? Authorizing Provider  albuterol (PROVENTIL HFA;VENTOLIN HFA) 108 (90 BASE) MCG/ACT inhaler Inhale 2 puffs into the lungs every 6 (six) hours as needed for wheezing.    Historical Provider, MD  ARIPiprazole (ABILIFY) 20 MG tablet Take 20 mg by mouth at bedtime.  07/24/11   Historical Provider, MD  Benzocaine-Menthol 10-2.1 MG LOZG Use as directed 1 lozenge in the mouth or throat as needed. 06/11/14   Renne Crigler, PA-C  benzonatate (TESSALON) 100 MG capsule Take 1 capsule (100 mg total) by mouth every 8 (eight) hours. 06/11/14   Renne Crigler, PA-C  cyclobenzaprine (FLEXERIL) 10 MG tablet Take 1 tablet (10 mg total) by mouth 2 (two) times daily as needed for muscle spasms. 12/17/13   Rachelle Hora, MD  ferrous sulfate 325 (65 FE) MG EC tablet Take 325 mg by mouth daily with breakfast.    Historical Provider, MD  ibuprofen (ADVIL,MOTRIN) 200 MG tablet Take 800 mg by mouth every 8 (eight) hours as needed for pain.    Historical Provider, MD  naproxen (NAPROSYN) 500 MG tablet Take 1 tablet (500 mg total) by mouth 2 (two) times daily. 06/11/14   Ivin Booty  Geiple, PA-C  ondansetron (ZOFRAN-ODT) 8 MG disintegrating tablet Take 1 tablet (8 mg total) by mouth every 8 (eight) hours as needed for nausea or vomiting. 08/03/14   Marisa Severin, MD  phentermine 15 MG capsule Take 15 mg by mouth every morning.    Historical Provider, MD   Triage Vitals: BP 119/58 mmHg  Pulse 78  Temp(Src) 98.1 F (36.7 C)  Resp 18  Ht  (1.702 m)  Wt 224 lb (101.606 kg)  BMI 35.08 kg/m2  SpO2 99%  LMP 07/16/2014   Physical Exam  Constitutional: She is oriented to person, place, and time. She appears well-developed and well-nourished. No distress.  HENT:  Head:  Normocephalic and atraumatic.  Eyes: EOM are normal.  Neck: Normal range of motion.  Cardiovascular: Normal rate, regular rhythm and normal heart sounds.   Pulmonary/Chest: Effort normal and breath sounds normal.  Abdominal: Soft. She exhibits no distension. There is tenderness.  Mild suprapubic tenderness   Genitourinary:  External genitalia appears normal. There is a whitish discharge present in the vaginal vault. No adnexal masses or tenderness No CMT   Musculoskeletal: Normal range of motion.  Neurological: She is alert and oriented to person, place, and time.  Skin: Skin is warm and dry.  Psychiatric: She has a normal mood and affect. Judgment normal.  Nursing note and vitals reviewed.   ED Course  Procedures (including critical care time)  DIAGNOSTIC STUDIES: Oxygen Saturation is 99% on RA, Normal by my interpretation.    COORDINATION OF CARE: 12:09 AM- Will order urine pregnancy and perform pelvic examination. Discussed treatment plan with pt at bedside and pt agreed to plan.     Labs Review Labs Reviewed  POC URINE PREG, ED    Imaging Review No results found.   EKG Interpretation None      MDM   Final diagnoses:  None    Wet prep reveals only a few white cells and physical exam is unremarkable. She will be discharged to home awaiting GC and Chlamydia culture.  I personally performed the services described in this documentation, which was scribed in my presence. The recorded information has been reviewed and is accurate.      Geoffery Lyons, MD 10/30/14 [phone]

## 2014-11-11 ENCOUNTER — Emergency Department (HOSPITAL_COMMUNITY)
Admission: EM | Admit: 2014-11-11 | Discharge: 2014-11-11 | Disposition: A | Discharge status: Home / Self Care | Payer: Medicare Other | Attending: Emergency Medicine | Admitting: Emergency Medicine

## 2014-11-11 ENCOUNTER — Encounter (HOSPITAL_COMMUNITY): Payer: Self-pay | Admitting: Emergency Medicine

## 2014-11-11 DIAGNOSIS — Z3A16 16 weeks gestation of pregnancy: Secondary | ICD-10-CM | POA: Diagnosis not present

## 2014-11-11 DIAGNOSIS — R202 Paresthesia of skin: Secondary | ICD-10-CM | POA: Diagnosis not present

## 2014-11-11 DIAGNOSIS — M79602 Pain in left arm: Secondary | ICD-10-CM | POA: Insufficient documentation

## 2014-11-11 DIAGNOSIS — Z79899 Other long term (current) drug therapy: Secondary | ICD-10-CM | POA: Diagnosis not present

## 2014-11-11 DIAGNOSIS — O9989 Other specified diseases and conditions complicating pregnancy, childbirth and the puerperium: Secondary | ICD-10-CM | POA: Diagnosis present

## 2014-11-11 DIAGNOSIS — Z8659 Personal history of other mental and behavioral disorders: Secondary | ICD-10-CM | POA: Insufficient documentation

## 2014-11-11 DIAGNOSIS — M79609 Pain in unspecified limb: Secondary | ICD-10-CM

## 2014-11-11 NOTE — ED Notes (Addendum)
Pt had bloodwork drawn Thursday and was told the nurse drawing blood hit a nerve. Says her arm was burning when she was drawing bloodwork. Pt reports numbness/tingling/burning in the left arm since then. Says, "it's a shooting sharp pain all the way down my arm since she drew blood." No other c/c. Slight swelling noted. Pt is 4 months pregnant. EDD is 04/22/15.

## 2014-11-11 NOTE — ED Provider Notes (Signed)
CSN: [id number]     Arrival date & time 11/11/14  2139 History   First MD Initiated Contact with Patient 11/11/14 2156     Chief Complaint  Patient presents with  . Arm Pain    left     Patient is a 29 y.o. female presenting with arm pain. The history is provided by the patient. No language interpreter was used.  Arm Pain   Dorothy Pham presents for evaluation of arm discomfort. She had phlebotomy performed of her left [facility] on June 30. At that time she developed a sharp burning pain from that area down to her wrist. The pain temporarily improved but now she has recurrent shooting and burning numbness sensation over the volar aspect of her forearm that goes to her wrist. Pain is worse with range of motion and activity. She denies any fevers, arm swelling. She is currently [redacted] weeks pregnant and is receiving prenatal care and has no problems with the pregnancy to this point. She has a history of schizoaffective disorder and is not currently on any medications for that illness at this time.  Past Medical History  Diagnosis Date  . Mental disorder   . Schizoaffective disorder   . Complication of anesthesia   . PONV (postoperative nausea and vomiting)   . Schizoaffective disorder    Past Surgical History  Procedure Laterality Date  . Breast surgery  2012    reduction   Family History  Problem Relation Age of Onset  . Anesthesia problems Neg Hx   . Diabetes Father   . Arthritis Father   . Arthritis Mother    History  Substance Use Topics  . Smoking status: Never Smoker   . Smokeless tobacco: Never Used  . Alcohol Use: No   OB History    Gravida Para Term Preterm AB TAB SAB Ectopic Multiple Living       Review of Systems  All other systems reviewed and are negative.     Allergies  Review of patient's allergies indicates no known allergies.  Home Medications   Prior to Admission medications   Medication Sig Start Date End Date Taking? Authorizing  Provider  Prenatal Vit-Fe Fumarate-FA (PNV PRENATAL PLUS MULTIVITAMIN) 27-1 MG TABS Take 1 tablet by mouth daily. 10/16/14  Yes Historical Provider, MD  Benzocaine-Menthol 10-2.1 MG LOZG Use as directed 1 lozenge in the mouth or throat as needed. Patient not taking: Reported on 10/30/2014 06/11/14   Renne Crigler, PA-C  benzonatate (TESSALON) 100 MG capsule Take 1 capsule (100 mg total) by mouth every 8 (eight) hours. Patient not taking: Reported on 10/30/2014 06/11/14   Renne Crigler, PA-C  cyclobenzaprine (FLEXERIL) 10 MG tablet Take 1 tablet (10 mg total) by mouth 2 (two) times daily as needed for muscle spasms. Patient not taking: Reported on 10/30/2014 12/17/13   Rachelle Hora, MD  naproxen (NAPROSYN) 500 MG tablet Take 1 tablet (500 mg total) by mouth 2 (two) times daily. Patient not taking: Reported on 10/30/2014 06/11/14   Renne Crigler, PA-C  ondansetron (ZOFRAN-ODT) 8 MG disintegrating tablet Take 1 tablet (8 mg total) by mouth every 8 (eight) hours as needed for nausea or vomiting. Patient not taking: Reported on 10/30/2014 08/03/14   Marisa Severin, MD   BP 116/54 mmHg  Pulse 78  Temp(Src) 98.2 F (36.8 C) (Oral)  Resp 18  SpO2 100%  LMP 07/16/2014 Physical Exam  Constitutional: She is oriented to person, place, and  time. She appears well-developed and well-nourished. No distress.  HENT:  Head: Normocephalic and atraumatic.  Cardiovascular: Normal rate.   Musculoskeletal:  2+ radial pulses bilaterally. There are no rashes over the arms. There is no swelling over bilateral upper extremities. Full range of motion in the digits, wrists, elbows, shoulders bilaterally. 5 out of 5 strength in bilateral upper extremities. Sensation to light touch intact throughout bilateral upper extremities.  Neurological: She is alert and oriented to person, place, and time. Coordination normal.  Nursing note and vitals reviewed.   ED Course  Procedures (including critical care time) Labs Review Labs Reviewed -  No data to display  Imaging Review No results found.   EKG Interpretation None      MDM   Final diagnoses:  Paresthesia and pain of left extremity    Patient here for evaluation of paresthesias of her left upper extremity following of the picture. Patient is neurovascularly intact on examination with no evidence of acute infectious process. The patient reassurance and discussed monitoring at home with outpatient follow-up with her PCP. If symptoms continue or worsen she may need referral to neurology. No medications at this time.  Tilden FossaElizabeth Arretta Toenjes, MD 11/11/14 2241

## 2014-11-11 NOTE — Discharge Instructions (Signed)

## 2014-12-29 ENCOUNTER — Other Ambulatory Visit (HOSPITAL_COMMUNITY): Payer: Self-pay | Admitting: Obstetrics and Gynecology

## 2014-12-29 ENCOUNTER — Other Ambulatory Visit (HOSPITAL_COMMUNITY): Payer: Medicare Other

## 2014-12-29 DIAGNOSIS — Z3A24 24 weeks gestation of pregnancy: Secondary | ICD-10-CM

## 2014-12-29 DIAGNOSIS — O35HXX0 Maternal care for other (suspected) fetal abnormality and damage, fetal lower extremities anomalies, not applicable or unspecified: Secondary | ICD-10-CM

## 2014-12-29 DIAGNOSIS — O358XX0 Maternal care for other (suspected) fetal abnormality and damage, not applicable or unspecified: Secondary | ICD-10-CM

## 2015-01-03 ENCOUNTER — Other Ambulatory Visit (HOSPITAL_COMMUNITY): Payer: Self-pay | Admitting: Obstetrics and Gynecology

## 2015-01-03 ENCOUNTER — Ambulatory Visit (HOSPITAL_COMMUNITY)
Admission: RE | Admit: 2015-01-03 | Discharge: 2015-01-03 | Disposition: A | Discharge status: Home / Self Care | Payer: Medicare Other | Source: Ambulatory Visit | Attending: Obstetrics and Gynecology | Admitting: Obstetrics and Gynecology

## 2015-01-03 ENCOUNTER — Encounter (HOSPITAL_COMMUNITY): Payer: Self-pay

## 2015-01-03 ENCOUNTER — Other Ambulatory Visit (HOSPITAL_COMMUNITY): Payer: Self-pay | Admitting: *Deleted

## 2015-01-03 DIAGNOSIS — O99212 Obesity complicating pregnancy, second trimester: Secondary | ICD-10-CM | POA: Diagnosis not present

## 2015-01-03 DIAGNOSIS — IMO0002 Reserved for concepts with insufficient information to code with codable children: Secondary | ICD-10-CM

## 2015-01-03 DIAGNOSIS — O358XX0 Maternal care for other (suspected) fetal abnormality and damage, not applicable or unspecified: Secondary | ICD-10-CM | POA: Diagnosis present

## 2015-01-03 DIAGNOSIS — O99342 Other mental disorders complicating pregnancy, second trimester: Secondary | ICD-10-CM

## 2015-01-03 DIAGNOSIS — Z3A24 24 weeks gestation of pregnancy: Secondary | ICD-10-CM | POA: Diagnosis not present

## 2015-01-03 DIAGNOSIS — O09292 Supervision of pregnancy with other poor reproductive or obstetric history, second trimester: Secondary | ICD-10-CM | POA: Diagnosis not present

## 2015-01-03 DIAGNOSIS — O35HXX0 Maternal care for other (suspected) fetal abnormality and damage, fetal lower extremities anomalies, not applicable or unspecified: Secondary | ICD-10-CM

## 2015-01-03 DIAGNOSIS — E669 Obesity, unspecified: Secondary | ICD-10-CM | POA: Insufficient documentation

## 2015-01-03 DIAGNOSIS — Z0489 Encounter for examination and observation for other specified reasons: Secondary | ICD-10-CM

## 2015-01-08 ENCOUNTER — Other Ambulatory Visit (HOSPITAL_COMMUNITY): Payer: Self-pay | Admitting: Obstetrics and Gynecology

## 2015-01-30 ENCOUNTER — Encounter (HOSPITAL_COMMUNITY): Payer: Self-pay | Admitting: *Deleted

## 2015-01-30 ENCOUNTER — Inpatient Hospital Stay (HOSPITAL_COMMUNITY): Payer: Medicare Other

## 2015-01-30 ENCOUNTER — Inpatient Hospital Stay (HOSPITAL_COMMUNITY)
Admission: AD | Admit: 2015-01-30 | Discharge: 2015-01-30 | Disposition: A | Discharge status: Home / Self Care | Payer: Medicare Other | Source: Ambulatory Visit | Attending: Obstetrics and Gynecology | Admitting: Obstetrics and Gynecology

## 2015-01-30 DIAGNOSIS — O99343 Other mental disorders complicating pregnancy, third trimester: Secondary | ICD-10-CM | POA: Diagnosis not present

## 2015-01-30 DIAGNOSIS — O99013 Anemia complicating pregnancy, third trimester: Secondary | ICD-10-CM | POA: Insufficient documentation

## 2015-01-30 DIAGNOSIS — O26893 Other specified pregnancy related conditions, third trimester: Secondary | ICD-10-CM | POA: Diagnosis not present

## 2015-01-30 DIAGNOSIS — D649 Anemia, unspecified: Secondary | ICD-10-CM | POA: Insufficient documentation

## 2015-01-30 DIAGNOSIS — Z3A28 28 weeks gestation of pregnancy: Secondary | ICD-10-CM | POA: Insufficient documentation

## 2015-01-30 DIAGNOSIS — R011 Cardiac murmur, unspecified: Secondary | ICD-10-CM | POA: Insufficient documentation

## 2015-01-30 DIAGNOSIS — F259 Schizoaffective disorder, unspecified: Secondary | ICD-10-CM | POA: Insufficient documentation

## 2015-01-30 DIAGNOSIS — R0602 Shortness of breath: Secondary | ICD-10-CM | POA: Diagnosis present

## 2015-01-30 HISTORY — DX: Cardiac murmur, unspecified: R01.1

## 2015-01-30 LAB — URINALYSIS, ROUTINE W REFLEX MICROSCOPIC
BILIRUBIN URINE: NEGATIVE
Glucose, UA: NEGATIVE mg/dL
Hgb urine dipstick: NEGATIVE
KETONES UR: NEGATIVE mg/dL
LEUKOCYTES UA: NEGATIVE
NITRITE: NEGATIVE
PH: 7 (ref 5.0–8.0)
Protein, ur: NEGATIVE mg/dL
SPECIFIC GRAVITY, URINE: 1.01 (ref 1.005–1.030)
UROBILINOGEN UA: 0.2 mg/dL (ref 0.0–1.0)

## 2015-01-30 LAB — COMPREHENSIVE METABOLIC PANEL
ALBUMIN: 3.4 g/dL — AB (ref 3.5–5.0)
ALT: 20 U/L (ref 14–54)
ANION GAP: 6 (ref 5–15)
AST: 20 U/L (ref 15–41)
Alkaline Phosphatase: 53 U/L (ref 38–126)
BUN: 9 mg/dL (ref 6–20)
CO2: 23 mmol/L (ref 22–32)
Calcium: 9.1 mg/dL (ref 8.9–10.3)
Chloride: 104 mmol/L (ref 101–111)
Creatinine, Ser: 0.54 mg/dL (ref 0.44–1.00)
GFR calc non Af Amer: >60 mL/min (ref >60)
GLUCOSE: 79 mg/dL (ref 65–99)
POTASSIUM: 4.1 mmol/L (ref 3.5–5.1)
SODIUM: 133 mmol/L — AB (ref 135–145)
Total Bilirubin: 0.2 mg/dL — ABNORMAL LOW (ref 0.3–1.2)
Total Protein: 7.5 g/dL (ref 6.5–8.1)

## 2015-01-30 LAB — CBC WITH DIFFERENTIAL/PLATELET
BASOS PCT: 0 %
Basophils Absolute: 0 10*3/uL (ref 0.0–0.1)
EOS ABS: 0 10*3/uL (ref 0.0–0.7)
EOS PCT: 0 %
HCT: 32.5 % — ABNORMAL LOW (ref 36.0–46.0)
Hemoglobin: 10.9 g/dL — ABNORMAL LOW (ref 12.0–15.0)
Lymphocytes Relative: 21 %
Lymphs Abs: 2.1 10*3/uL (ref 0.7–4.0)
MCH: 30.4 pg (ref 26.0–34.0)
MCHC: 33.5 g/dL (ref 30.0–36.0)
MCV: 90.8 fL (ref 78.0–100.0)
MONO ABS: 0.6 10*3/uL (ref 0.1–1.0)
MONOS PCT: 6 %
NEUTROS PCT: 73 %
Neutro Abs: 7.3 10*3/uL (ref 1.7–7.7)
PLATELETS: 272 10*3/uL (ref 150–400)
RBC: 3.58 MIL/uL — ABNORMAL LOW (ref 3.87–5.11)
RDW: 13.3 % (ref 11.5–15.5)
WBC: 9.9 10*3/uL (ref 4.0–10.5)

## 2015-01-30 LAB — TSH: TSH: 1.18 u[IU]/mL (ref 0.350–4.500)

## 2015-01-30 MED ORDER — FERROUS SULFATE 325 (65 FE) MG PO TABS: 325.0000 mg | ORAL_TABLET | Freq: Every day | ORAL | Status: DC

## 2015-01-30 NOTE — MAU Note (Signed)
Sent from office, condition started today, worsening SOB.  Denies allergies or asthma

## 2015-01-30 NOTE — MAU Provider Note (Signed)
History   29 yo G2P0010 at 22 2/7 weeks presented from the office for further evaluation of SOB--has reported this several times during pregnancy, but worsened today.  Denies chest pain, cough, URI sx, abdominal pain, dysuria, fever, leaking of fluid, bleeding.  Reports some feelings of fatigue and "weakness", but denies syncope.  Reports +FM.  No hx of this prior to pregnancy.  Patient Active Problem List   Diagnosis Date Noted  . Schizoaffective disorder 01/30/2015  . Dysplasia of cervix, low grade (CIN 1) 04/21/2012  . Amniotic band syndrome--2013 pregnancy 09/24/2011  . 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 Obesity 09/24/2011    Chief Complaint  Patient presents with  . Shortness of Breath   HPI:  See above  OB History    Gravida Para Term Preterm AB TAB SAB Ectopic Multiple Living        Obstetric Comments   Fetus with anomalies incompatible with life--induced at [facility]      Past Medical History  Diagnosis Date  . Mental disorder   . Schizoaffective disorder   . Complication of anesthesia   . PONV (postoperative nausea and vomiting)   . Schizoaffective disorder   . Heart murmur     Past Surgical History  Procedure Laterality Date  . Breast surgery  2012    reduction    Family History  Problem Relation Age of Onset  . Anesthesia problems Neg Hx   . Diabetes Father   . Arthritis Father   . Arthritis Mother     Social History  Substance Use Topics  . Smoking status: Never Smoker   . Smokeless tobacco: Never Used  . Alcohol Use: No    Allergies: No Known Allergies  Prescriptions prior to admission  Medication Sig Dispense Refill Last Dose  . Benzocaine-Menthol 10-2.1 MG LOZG Use as directed 1 lozenge in the mouth or throat as needed. (Patient not taking: Reported on 10/30/2014) 18 each 0 Not Taking at Unknown time  . benzonatate (TESSALON) 100 MG capsule Take 1 capsule (100 mg total) by mouth every 8  (eight) hours. (Patient not taking: Reported on 10/30/2014) 15 capsule 0 Not Taking at Unknown time  . cyclobenzaprine (FLEXERIL) 10 MG tablet Take 1 tablet (10 mg total) by mouth 2 (two) times daily as needed for muscle spasms. (Patient not taking: Reported on 10/30/2014) 4 tablet 0 Not Taking at Unknown time  . naproxen (NAPROSYN) 500 MG tablet Take 1 tablet (500 mg total) by mouth 2 (two) times daily. (Patient not taking: Reported on 10/30/2014) 20 tablet 0 Not Taking at Unknown time  . ondansetron (ZOFRAN-ODT) 8 MG disintegrating tablet Take 1 tablet (8 mg total) by mouth every 8 (eight) hours as needed for nausea or vomiting. (Patient not taking: Reported on 10/30/2014) 20 tablet 0 Not Taking at Unknown time  . Prenatal Vit-Fe Fumarate-FA (PNV PRENATAL PLUS MULTIVITAMIN) 27-1 MG TABS Take 1 tablet by mouth daily.  0 11/10/2014 at Unknown time    ROS:  SOB, +FM Physical Exam   Blood pressure 106/57, pulse 75, temperature 97.7 F (36.5 C), temperature source Oral, resp. rate 24, last menstrual period 07/16/2014, SpO2 100 %.  Physical Exam  Appears mildly anxious Chest clear, but respirations shallow Heart RRR, I-II/IV systolic murmur Abd gravid, NT Pelvic--deferred Ext--DTR 1+, no clonus, no edema  FHR Category 1 No UCs  ED Course  Assessment: IUP at 28 2/7 weeks Worsening SOB  Schizo-affective dx  Plan: CBC, diff, CMP, TSH, UA Chest Xray   Nigel Bridgeman CNM, MSN 01/30/2015 1:02 PM  Addendum:  Has eaten and rested--no obvious SOB at rest.  Mother at bedside.  CXR WNL.  Results for orders placed or performed during the hospital encounter of 01/30/15 (from the past 24 hour(s))  Urinalysis, Routine w reflex microscopic (not at [facility])     Status: None   Collection Time: 01/30/15 12:23 PM  Result Value Ref Range   Color, Urine YELLOW YELLOW   APPearance CLEAR CLEAR   Specific Gravity, Urine 1.010 1.005 - 1.030   pH 7.0 5.0 - 8.0   Glucose, UA NEGATIVE NEGATIVE mg/dL   Hgb  urine dipstick NEGATIVE NEGATIVE   Bilirubin Urine NEGATIVE NEGATIVE   Ketones, ur NEGATIVE NEGATIVE mg/dL   Protein, ur NEGATIVE NEGATIVE mg/dL   Urobilinogen, UA 0.2 0.0 - 1.0 mg/dL   Nitrite NEGATIVE NEGATIVE   Leukocytes, UA NEGATIVE NEGATIVE  CBC with Differential/Platelet     Status: Abnormal   Collection Time: 01/30/15 12:30 PM  Result Value Ref Range   WBC 9.9 4.0 - 10.5 K/uL   RBC 3.58 (L) 3.87 - 5.11 MIL/uL   Hemoglobin 10.9 (L) 12.0 - 15.0 g/dL   HCT 16.1 (L) 09.6 - 04.5 %   MCV 90.8 78.0 - 100.0 fL   MCH 30.4 26.0 - 34.0 pg   MCHC 33.5 30.0 - 36.0 g/dL   RDW 40.9 81.1 - 91.4 %   Platelets 272 150 - 400 K/uL   Neutrophils Relative % 73 %   Neutro Abs 7.3 1.7 - 7.7 K/uL   Lymphocytes Relative 21 %   Lymphs Abs 2.1 0.7 - 4.0 K/uL   Monocytes Relative 6 %   Monocytes Absolute 0.6 0.1 - 1.0 K/uL   Eosinophils Relative 0 %   Eosinophils Absolute 0.0 0.0 - 0.7 K/uL   Basophils Relative 0 %   Basophils Absolute 0.0 0.0 - 0.1 K/uL  Comprehensive metabolic panel     Status: Abnormal   Collection Time: 01/30/15 12:30 PM  Result Value Ref Range   Sodium 133 (L) 135 - 145 mmol/L   Potassium 4.1 3.5 - 5.1 mmol/L   Chloride 104 101 - 111 mmol/L   CO2 23 22 - 32 mmol/L   Glucose, Bld 79 65 - 99 mg/dL   BUN 9 6 - 20 mg/dL   Creatinine, Ser 7.82 0.44 - 1.00 mg/dL   Calcium 9.1 8.9 - 95.6 mg/dL   Total Protein 7.5 6.5 - 8.1 g/dL   Albumin 3.4 (L) 3.5 - 5.0 g/dL   AST 20 15 - 41 U/L   ALT 20 14 - 54 U/L   Alkaline Phosphatase 53 38 - 126 U/L   Total Bilirubin 0.2 (L) 0.3 - 1.2 mg/dL   GFR calc non Af Amer >60 >60 mL/min   GFR calc Af Amer >60 >60 mL/min   Anion gap 6 5 - 15  TSH     Status: None   Collection Time: 01/30/15 12:30 PM  Result Value Ref Range   TSH 1.180 0.350 - 4.500 uIU/mL   Filed Vitals:   01/30/15 1231 01/30/15 1234 01/30/15 1238 01/30/15 1445  BP:  106/57  118/67  Pulse: 86 103 75 95  Temp:   97.7 F (36.5 C) 97.6 F (36.4 C)  TempSrc:   Oral    Resp:  24  20  SpO2: 99% 100% 100%    Impression: IUP at 28 2/7 weeks  SOB, no evidence of acute compromise Grade I-II/IV murmur Category 1 FHR Mild anemia  Plan: Consulted with Dr. Sallye Ober. D/C home with precautions reviewed--patient to call with any worsening of sx, chest pain, syncope, etc. Will refer to Cardiology for evaluation of on-going SOB and current murmur. Keep f/u appt at [facility] for next week. Note for work--to RTW 02/01/15. Rx ferrous sulfate 1 po q day.  Nigel Bridgeman, CNM 01/30/15 2:40p

## 2015-01-30 NOTE — MAU Note (Signed)
Pt was sent from [facility] due to SOB that has progressed since 3-4 weeks ago but today much worse.  Pt states she has recently dx with heart murmur (Dr. Roseanne Reno).  Good fetal movement.  Denies vaginal bleeding, ROM, or abnormal discharge.

## 2015-01-30 NOTE — Progress Notes (Signed)
V. Latham CNM in earlier to discuss d/c plan. Written and verbal d/c instructions given and understanding voiced. 

## 2015-01-30 NOTE — Discharge Instructions (Signed)

## 2015-01-31 ENCOUNTER — Ambulatory Visit (HOSPITAL_COMMUNITY)
Admission: RE | Admit: 2015-01-31 | Discharge: 2015-01-31 | Disposition: A | Discharge status: Home / Self Care | Payer: Medicare Other | Source: Ambulatory Visit | Attending: Obstetrics and Gynecology | Admitting: Obstetrics and Gynecology

## 2015-01-31 ENCOUNTER — Other Ambulatory Visit (HOSPITAL_COMMUNITY): Payer: Self-pay | Admitting: Obstetrics and Gynecology

## 2015-01-31 ENCOUNTER — Encounter (HOSPITAL_COMMUNITY): Payer: Self-pay

## 2015-01-31 VITALS — BP 125/61 | HR 91 | Wt 236.8 lb

## 2015-01-31 DIAGNOSIS — E669 Obesity, unspecified: Secondary | ICD-10-CM | POA: Insufficient documentation

## 2015-01-31 DIAGNOSIS — Z36 Encounter for antenatal screening of mother: Secondary | ICD-10-CM | POA: Diagnosis not present

## 2015-01-31 DIAGNOSIS — O99213 Obesity complicating pregnancy, third trimester: Secondary | ICD-10-CM | POA: Insufficient documentation

## 2015-01-31 DIAGNOSIS — Z3A28 28 weeks gestation of pregnancy: Secondary | ICD-10-CM | POA: Diagnosis not present

## 2015-01-31 DIAGNOSIS — O359XX0 Maternal care for (suspected) fetal abnormality and damage, unspecified, not applicable or unspecified: Secondary | ICD-10-CM

## 2015-01-31 DIAGNOSIS — O09293 Supervision of pregnancy with other poor reproductive or obstetric history, third trimester: Secondary | ICD-10-CM | POA: Insufficient documentation

## 2015-01-31 DIAGNOSIS — Z0489 Encounter for examination and observation for other specified reasons: Secondary | ICD-10-CM

## 2015-01-31 DIAGNOSIS — O99343 Other mental disorders complicating pregnancy, third trimester: Secondary | ICD-10-CM

## 2015-01-31 DIAGNOSIS — IMO0002 Reserved for concepts with insufficient information to code with codable children: Secondary | ICD-10-CM

## 2015-01-31 DIAGNOSIS — O358XX0 Maternal care for other (suspected) fetal abnormality and damage, not applicable or unspecified: Secondary | ICD-10-CM | POA: Insufficient documentation

## 2015-02-02 ENCOUNTER — Telehealth: Payer: Self-pay | Admitting: Internal Medicine

## 2015-02-02 NOTE — Telephone Encounter (Signed)
Received records from [facility] for appointment on 02/16/15 with Dr Rennis Golden.  Records given to [facility] (medical records) for Dr [facility] schedule on 02/16/15. lp

## 2015-02-07 ENCOUNTER — Ambulatory Visit (HOSPITAL_COMMUNITY)
Admission: RE | Admit: 2015-02-07 | Discharge: 2015-02-07 | Disposition: A | Discharge status: Home / Self Care | Payer: Medicare Other | Source: Ambulatory Visit | Attending: Obstetrics and Gynecology | Admitting: Obstetrics and Gynecology

## 2015-02-07 ENCOUNTER — Other Ambulatory Visit (HOSPITAL_COMMUNITY): Payer: Self-pay | Admitting: Obstetrics and Gynecology

## 2015-02-07 ENCOUNTER — Encounter (HOSPITAL_COMMUNITY): Payer: Self-pay

## 2015-02-07 VITALS — BP 131/80 | HR 80 | Wt 240.0 lb

## 2015-02-07 DIAGNOSIS — O09293 Supervision of pregnancy with other poor reproductive or obstetric history, third trimester: Secondary | ICD-10-CM | POA: Diagnosis not present

## 2015-02-07 DIAGNOSIS — Z3A29 29 weeks gestation of pregnancy: Secondary | ICD-10-CM

## 2015-02-07 DIAGNOSIS — O99213 Obesity complicating pregnancy, third trimester: Secondary | ICD-10-CM | POA: Diagnosis not present

## 2015-02-07 DIAGNOSIS — O365990 Maternal care for other known or suspected poor fetal growth, unspecified trimester, not applicable or unspecified: Secondary | ICD-10-CM

## 2015-02-07 DIAGNOSIS — O99343 Other mental disorders complicating pregnancy, third trimester: Secondary | ICD-10-CM

## 2015-02-07 DIAGNOSIS — O365920 Maternal care for other known or suspected poor fetal growth, second trimester, not applicable or unspecified: Secondary | ICD-10-CM | POA: Diagnosis not present

## 2015-02-07 DIAGNOSIS — IMO0002 Reserved for concepts with insufficient information to code with codable children: Secondary | ICD-10-CM

## 2015-02-07 DIAGNOSIS — E669 Obesity, unspecified: Secondary | ICD-10-CM | POA: Insufficient documentation

## 2015-02-08 ENCOUNTER — Other Ambulatory Visit (HOSPITAL_COMMUNITY): Payer: Self-pay | Admitting: Obstetrics and Gynecology

## 2015-02-08 DIAGNOSIS — O365920 Maternal care for other known or suspected poor fetal growth, second trimester, not applicable or unspecified: Secondary | ICD-10-CM

## 2015-02-14 ENCOUNTER — Ambulatory Visit (HOSPITAL_COMMUNITY)
Admission: RE | Admit: 2015-02-14 | Discharge: 2015-02-14 | Disposition: A | Discharge status: Home / Self Care | Payer: Medicare Other | Source: Ambulatory Visit | Attending: Obstetrics and Gynecology | Admitting: Obstetrics and Gynecology

## 2015-02-14 ENCOUNTER — Other Ambulatory Visit (HOSPITAL_COMMUNITY): Payer: Self-pay | Admitting: Obstetrics and Gynecology

## 2015-02-14 ENCOUNTER — Ambulatory Visit (HOSPITAL_COMMUNITY): Payer: Medicare Other

## 2015-02-14 DIAGNOSIS — O09293 Supervision of pregnancy with other poor reproductive or obstetric history, third trimester: Secondary | ICD-10-CM | POA: Insufficient documentation

## 2015-02-14 DIAGNOSIS — IMO0002 Reserved for concepts with insufficient information to code with codable children: Secondary | ICD-10-CM

## 2015-02-14 DIAGNOSIS — O365930 Maternal care for other known or suspected poor fetal growth, third trimester, not applicable or unspecified: Secondary | ICD-10-CM | POA: Diagnosis not present

## 2015-02-14 DIAGNOSIS — O99343 Other mental disorders complicating pregnancy, third trimester: Secondary | ICD-10-CM | POA: Insufficient documentation

## 2015-02-14 DIAGNOSIS — Z3A30 30 weeks gestation of pregnancy: Secondary | ICD-10-CM

## 2015-02-14 DIAGNOSIS — E669 Obesity, unspecified: Secondary | ICD-10-CM | POA: Insufficient documentation

## 2015-02-14 DIAGNOSIS — O365920 Maternal care for other known or suspected poor fetal growth, second trimester, not applicable or unspecified: Secondary | ICD-10-CM

## 2015-02-16 ENCOUNTER — Encounter: Payer: Self-pay | Admitting: Internal Medicine

## 2015-02-16 ENCOUNTER — Ambulatory Visit (INDEPENDENT_AMBULATORY_CARE_PROVIDER_SITE_OTHER): Payer: Medicare Other | Admitting: Internal Medicine

## 2015-02-16 VITALS — BP 126/74 | HR 92 | Ht 67.5 in | Wt 240.9 lb

## 2015-02-16 DIAGNOSIS — Z3490 Encounter for supervision of normal pregnancy, unspecified, unspecified trimester: Secondary | ICD-10-CM

## 2015-02-16 DIAGNOSIS — R0609 Other forms of dyspnea: Secondary | ICD-10-CM

## 2015-02-16 DIAGNOSIS — E669 Obesity, unspecified: Secondary | ICD-10-CM

## 2015-02-16 DIAGNOSIS — R011 Cardiac murmur, unspecified: Secondary | ICD-10-CM | POA: Diagnosis not present

## 2015-02-16 DIAGNOSIS — R0602 Shortness of breath: Secondary | ICD-10-CM

## 2015-02-16 NOTE — Progress Notes (Signed)
OFFICE NOTE  Chief Complaint:  Murmur, DOE  Primary Care Physician: Quitman Livings, MD  HPI:  Dorothy Pham is a pleasant 29 year old female who is more than [redacted] weeks pregnant. She presents today for evaluation of shortness of breath and heart murmur. Belly she's had a heart murmur for some time however she was told that it was not anything serious. Recently she's been more short of breath and was referred to me for evaluation. She reports her shortness of breath is mostly with exertion she denies any orthopnea or PND. She's had appropriate weight gain with the pregnancy. She has no lower extremity swelling. She denies any chest pain or palpitations associated with this. This is her second pregnancy, the first which was terminated apparently due to genetic abnormalities.  PMHx:  Past Medical History  Diagnosis Date  . Mental disorder   . Schizoaffective disorder   . Complication of anesthesia   . PONV (postoperative nausea and vomiting)   . Schizoaffective disorder   . Heart murmur     Past Surgical History  Procedure Laterality Date  . Breast surgery  2012    reduction    FAMHx:  Family History  Problem Relation Age of Onset  . Anesthesia problems Neg Hx   . Diabetes Father   . Arthritis Father   . Arthritis Mother     SOCHx:   reports that she has never smoked. She has never used smokeless tobacco. She reports that she does not drink alcohol or use illicit drugs.  ALLERGIES:  No Known Allergies  ROS: A comprehensive review of systems was negative except for: Constitutional: positive for fatigue Respiratory: positive for dyspnea on exertion  HOME MEDS: Current Outpatient Prescriptions  Medication Sig Dispense Refill  . ferrous sulfate 325 (65 FE) MG tablet Take 1 tablet (325 mg total) by mouth daily. 30 tablet 3  . Prenatal Vit-Fe Fumarate-FA (PNV PRENATAL PLUS MULTIVITAMIN) 27-1 MG TABS Take 1 tablet by mouth daily.  0   No current facility-administered  medications for this visit.    LABS/IMAGING: No results found for this or any previous visit (from the past 48 hour(s)). [place] Mfm Fetal Bpp Wo Non Stress  02/14/2015   OBSTETRICAL ULTRASOUND: This exam was performed within a [place] [facility] Department. The OB US report was generated in the AS system, and faxed to the ordering physician.   This report is available in the [employer]. See the AS Obstetric US report via the Image Link.  [place] Mfm Ua Cord Doppler  02/14/2015   OBSTETRICAL ULTRASOUND: This exam was performed within a [place] [facility] Department. The OB US report was generated in the AS system, and faxed to the ordering physician.   This report is available in the [employer]. See the AS Obstetric US report via the Image Link.   WEIGHTS: Wt Readings from Last 3 Encounters:  02/16/15 240 lb 14.4 oz (109.272 kg)  02/14/15 240 lb 12.8 oz (109.226 kg)  02/07/15 240 lb (108.863 kg)    VITALS: BP 126/74 mmHg  Pulse 92  Ht 5' 7.5" (1.715 m)  Wt 240 lb 14.4 oz (109.272 kg)  BMI 37.15 kg/m2  LMP 07/16/2014  EXAM: General appearance: alert, no distress and moderately obese Neck: no carotid bruit and no JVD Lungs: diminished breath sounds bibasilar Heart: regular rate and rhythm, S1, S2 normal and systolic murmur: early systolic 3/6, blowing at apex Abdomen: gravid uterus Extremities: extremities normal, atraumatic, no cyanosis or edema Pulses: 2+ and  symmetric Skin: Skin color, texture, turgor normal. No rashes or lesions Neurologic: Grossly normal Psych: Pleasant  EKG: Normal sinus rhythm at 92  ASSESSMENT: 1. [redacted] weeks pregnant 2. Dyspnea and exertion 3. Murmur 4. Moderate obesity  PLAN: 1.   Ms. Round is describing some shortness of breath with exertion. Her physical exam findings do not support cardiomyopathy however she is in the time of pregnancy where peripartum cardiomyopathy must be considered. There is a systolic murmur which sounds like mitral  regurgitation. I would like to check an echocardiogram to further characterize this. I suspect we will not adjust our management as there is no clear sign of volume overload or heart failure. Her shortness of breath may likely be due to the size of the baby, decrease tidal volume and some element of restriction.   Plan to see her back to discuss the echocardiogram in a few weeks. Thanks for the kind referral.  Chrystie Nose, MD, [facility] Attending Cardiologist [facility]  Chrystie Nose 02/16/2015, 12:53 PM

## 2015-02-16 NOTE — Patient Instructions (Signed)
Your physician has requested that you have an echocardiogram. Echocardiography is a painless test that uses sound waves to create images of your heart. It provides your doctor with information about the size and shape of your heart and how well your heart's chambers and valves are working. This procedure takes approximately one hour. There are no restrictions for this procedure.  This test will be done at our [facility] location. The address is [address], [address]. The phone number is [phone] if you have any concerns or need directions.  Dr Rennis Golden recommends that you schedule a follow-up appointment in 1 month.

## 2015-02-21 ENCOUNTER — Ambulatory Visit (HOSPITAL_COMMUNITY): Payer: Medicare Other

## 2015-02-21 ENCOUNTER — Encounter (HOSPITAL_COMMUNITY): Payer: Self-pay

## 2015-02-21 ENCOUNTER — Ambulatory Visit (HOSPITAL_COMMUNITY)
Admission: RE | Admit: 2015-02-21 | Discharge: 2015-02-21 | Disposition: A | Discharge status: Home / Self Care | Payer: Medicare Other | Source: Ambulatory Visit | Attending: Obstetrics and Gynecology | Admitting: Obstetrics and Gynecology

## 2015-02-21 DIAGNOSIS — IMO0002 Reserved for concepts with insufficient information to code with codable children: Secondary | ICD-10-CM

## 2015-02-21 DIAGNOSIS — O365920 Maternal care for other known or suspected poor fetal growth, second trimester, not applicable or unspecified: Secondary | ICD-10-CM | POA: Diagnosis not present

## 2015-02-26 ENCOUNTER — Other Ambulatory Visit (HOSPITAL_COMMUNITY): Payer: Medicare Other

## 2015-02-28 ENCOUNTER — Encounter (HOSPITAL_COMMUNITY): Payer: Self-pay

## 2015-02-28 ENCOUNTER — Other Ambulatory Visit (HOSPITAL_COMMUNITY): Payer: Self-pay | Admitting: Obstetrics and Gynecology

## 2015-02-28 ENCOUNTER — Ambulatory Visit (HOSPITAL_COMMUNITY)
Admission: RE | Admit: 2015-02-28 | Discharge: 2015-02-28 | Disposition: A | Discharge status: Home / Self Care | Payer: Medicare Other | Source: Ambulatory Visit | Attending: Obstetrics and Gynecology | Admitting: Obstetrics and Gynecology

## 2015-02-28 DIAGNOSIS — Z3A32 32 weeks gestation of pregnancy: Secondary | ICD-10-CM

## 2015-02-28 DIAGNOSIS — O09299 Supervision of pregnancy with other poor reproductive or obstetric history, unspecified trimester: Secondary | ICD-10-CM

## 2015-02-28 DIAGNOSIS — O99340 Other mental disorders complicating pregnancy, unspecified trimester: Secondary | ICD-10-CM | POA: Diagnosis not present

## 2015-02-28 DIAGNOSIS — O365920 Maternal care for other known or suspected poor fetal growth, second trimester, not applicable or unspecified: Secondary | ICD-10-CM | POA: Diagnosis present

## 2015-02-28 DIAGNOSIS — O99213 Obesity complicating pregnancy, third trimester: Secondary | ICD-10-CM

## 2015-02-28 DIAGNOSIS — E669 Obesity, unspecified: Secondary | ICD-10-CM | POA: Diagnosis not present

## 2015-02-28 DIAGNOSIS — O365990 Maternal care for other known or suspected poor fetal growth, unspecified trimester, not applicable or unspecified: Secondary | ICD-10-CM

## 2015-03-05 ENCOUNTER — Encounter (HOSPITAL_COMMUNITY): Payer: Self-pay | Admitting: *Deleted

## 2015-03-05 ENCOUNTER — Inpatient Hospital Stay (HOSPITAL_COMMUNITY)
Admission: EM | Admit: 2015-03-05 | Discharge: 2015-03-06 | Disposition: A | Discharge status: Home / Self Care | Payer: Medicare Other | Source: Ambulatory Visit | Attending: Obstetrics and Gynecology | Admitting: Obstetrics and Gynecology

## 2015-03-05 DIAGNOSIS — Z3A33 33 weeks gestation of pregnancy: Secondary | ICD-10-CM | POA: Diagnosis not present

## 2015-03-05 DIAGNOSIS — L910 Hypertrophic scar: Secondary | ICD-10-CM | POA: Diagnosis not present

## 2015-03-05 DIAGNOSIS — O26893 Other specified pregnancy related conditions, third trimester: Secondary | ICD-10-CM | POA: Diagnosis not present

## 2015-03-05 DIAGNOSIS — F419 Anxiety disorder, unspecified: Secondary | ICD-10-CM | POA: Insufficient documentation

## 2015-03-05 DIAGNOSIS — O99343 Other mental disorders complicating pregnancy, third trimester: Secondary | ICD-10-CM | POA: Diagnosis not present

## 2015-03-05 DIAGNOSIS — R253 Fasciculation: Secondary | ICD-10-CM | POA: Diagnosis present

## 2015-03-05 LAB — CBC
HCT: 32.7 % — ABNORMAL LOW (ref 36.0–46.0)
HEMOGLOBIN: 11 g/dL — AB (ref 12.0–15.0)
MCH: 30.5 pg (ref 26.0–34.0)
MCHC: 33.6 g/dL (ref 30.0–36.0)
MCV: 90.6 fL (ref 78.0–100.0)
Platelets: 250 10*3/uL (ref 150–400)
RBC: 3.61 MIL/uL — ABNORMAL LOW (ref 3.87–5.11)
RDW: 13 % (ref 11.5–15.5)
WBC: 10.3 10*3/uL (ref 4.0–10.5)

## 2015-03-05 LAB — GLUCOSE, CAPILLARY: GLUCOSE-CAPILLARY: 111 mg/dL — AB (ref 65–99)

## 2015-03-05 NOTE — MAU Provider Note (Signed)
Dorothy Pham is a 29 y.o. G2P0 at 33.1 weeks present unannounced c/o twitching x 3 nights, + FM no VB, LOF or CTX.  She also c/o not eating for the last few days.  She report drinking water all day.  She shared she has a hx of anxiety and she is having problems with the FOB.  She regularly see Dr Caroll Rancher for her anxiety.  She stopped her medication when she got pregnant.  She has an appointment with Dr Wallace Cullens tomorrow.  Pt denies feeling of suicide or homicide.  She feel safe at home.   Pt also c/o incision pain where she had a breast reduction.   History     Patient Active Problem List   Diagnosis Date Noted  . Murmur, cardiac 02/16/2015  . DOE (dyspnea on exertion) 02/16/2015  . Schizoaffective disorder 01/30/2015  . Dysplasia of cervix, low grade (CIN 1) 04/21/2012  . Amniotic band syndrome--2013 pregnancy 09/24/2011  . 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 Obesity 09/24/2011    Chief Complaint  Patient presents with  . Fatigue   HPI  OB History    Gravida Para Term Preterm AB TAB SAB Ectopic Multiple Living        Obstetric Comments   Fetus with anomalies--induced per [facility] at [facility]      Past Medical History  Diagnosis Date  . Mental disorder   . Schizoaffective disorder   . Complication of anesthesia   . PONV (postoperative nausea and vomiting)   . Schizoaffective disorder   . Heart murmur     Past Surgical History  Procedure Laterality Date  . Breast surgery  2012    reduction    Family History  Problem Relation Age of Onset  . Anesthesia problems Neg Hx   . Diabetes Father   . Arthritis Father   . Arthritis Mother   . Diabetes Maternal Grandmother   . Hypertension Maternal Grandmother   . Hypertension Maternal Grandfather   . Diabetes Maternal Grandfather   . Diabetes Paternal Grandmother     Social History  Substance Use Topics  . Smoking status: Never Smoker   . Smokeless  tobacco: Never Used  . Alcohol Use: No    Allergies: No Known Allergies  Prescriptions prior to admission  Medication Sig Dispense Refill Last Dose  . ferrous sulfate 325 (65 FE) MG tablet Take 1 tablet (325 mg total) by mouth daily. 30 tablet 3 Taking  . Prenatal Vit-Fe Fumarate-FA (PNV PRENATAL PLUS MULTIVITAMIN) 27-1 MG TABS Take 1 tablet by mouth daily.  0 Taking    ROS See HPI above, all other systems are negative  Physical Exam   Blood pressure 149/82, pulse 107, temperature 98.6 F (37 C), temperature source Oral, resp. rate 16, last menstrual period 07/16/2014.  Physical Exam Ext:  WNL ABD: Soft, non tender to palpation, no rebound or guarding SVE: deferred   ED Course  Assessment: IUP at  33.1weeks Membranes: intact FHR: Category 1 CTX:  None  Keloid at the incision under both breast and lateral to the under arm pit.  No redness, irration, or swelling noted   Results for orders placed or performed during the hospital encounter of 03/05/15 (from the past 24 hour(s))  CBC     Status: Abnormal   Collection Time: 03/05/15  9:45 PM  Result Value Ref Range   WBC 10.3 4.0 - 10.5 K/uL  RBC 3.61 (L) 3.87 - 5.11 MIL/uL   Hemoglobin 11.0 (L) 12.0 - 15.0 g/dL   HCT 14.732.7 (L) 82.936.0 - 56.246.0 %   MCV 90.6 78.0 - 100.0 fL   MCH 30.5 26.0 - 34.0 pg   MCHC 33.6 30.0 - 36.0 g/dL   RDW 13.013.0 86.511.5 - 78.415.5 %   Platelets 250 150 - 400 K/uL  CBG 111  Given crackers and/or a sandwish Observed fir 3 hours prior to DC   Plan: -Pt to keep her mental health appointment tomorrow with Dr Wallace CullensGray. -Discussed need to follow up in office next week for her ROB -Bleeding and PTL Precautions -Encouraged to call if any questions or concerns arise prior to next scheduled office visit.  -Discharged to home in stable condition -tylenol PRN -eat a regular diet -Pt encourage to [facility] for an anxiety crisis or call [address]    Klaudia Beirne, CNM, MSN 03/05/2015. 9:18  PM

## 2015-03-05 NOTE — MAU Note (Signed)
Pt states she feels weak and feels the baby twitching.  Pt states it has been happening for 3 nights in a row.  She states she is still feeling the baby move in addition to the twitching.  Pt states she is drinking about 8 or 9 bottles of water per day.  Pt states she has been feeling a lot more depressed more recently in her pregnancy.

## 2015-03-06 DIAGNOSIS — O26893 Other specified pregnancy related conditions, third trimester: Secondary | ICD-10-CM | POA: Diagnosis not present

## 2015-03-07 ENCOUNTER — Ambulatory Visit (HOSPITAL_COMMUNITY)
Admission: RE | Admit: 2015-03-07 | Discharge: 2015-03-07 | Disposition: A | Discharge status: Home / Self Care | Payer: Medicare Other | Source: Ambulatory Visit | Attending: Obstetrics and Gynecology | Admitting: Obstetrics and Gynecology

## 2015-03-07 ENCOUNTER — Encounter (HOSPITAL_COMMUNITY): Payer: Self-pay

## 2015-03-07 DIAGNOSIS — O365920 Maternal care for other known or suspected poor fetal growth, second trimester, not applicable or unspecified: Secondary | ICD-10-CM | POA: Insufficient documentation

## 2015-03-14 ENCOUNTER — Encounter (HOSPITAL_COMMUNITY): Payer: Self-pay

## 2015-03-14 ENCOUNTER — Ambulatory Visit (HOSPITAL_COMMUNITY): Payer: Medicare Other

## 2015-03-14 ENCOUNTER — Ambulatory Visit (HOSPITAL_COMMUNITY)
Admission: RE | Admit: 2015-03-14 | Discharge: 2015-03-14 | Disposition: A | Discharge status: Home / Self Care | Payer: Medicare Other | Source: Ambulatory Visit | Attending: Obstetrics and Gynecology | Admitting: Obstetrics and Gynecology

## 2015-03-14 DIAGNOSIS — O99213 Obesity complicating pregnancy, third trimester: Secondary | ICD-10-CM | POA: Insufficient documentation

## 2015-03-14 DIAGNOSIS — O365920 Maternal care for other known or suspected poor fetal growth, second trimester, not applicable or unspecified: Secondary | ICD-10-CM

## 2015-03-14 DIAGNOSIS — Z3A34 34 weeks gestation of pregnancy: Secondary | ICD-10-CM | POA: Insufficient documentation

## 2015-03-14 DIAGNOSIS — O99340 Other mental disorders complicating pregnancy, unspecified trimester: Secondary | ICD-10-CM | POA: Diagnosis not present

## 2015-03-14 DIAGNOSIS — O09299 Supervision of pregnancy with other poor reproductive or obstetric history, unspecified trimester: Secondary | ICD-10-CM | POA: Diagnosis not present

## 2015-03-14 DIAGNOSIS — O365990 Maternal care for other known or suspected poor fetal growth, unspecified trimester, not applicable or unspecified: Secondary | ICD-10-CM | POA: Insufficient documentation

## 2015-03-21 ENCOUNTER — Ambulatory Visit (HOSPITAL_COMMUNITY)
Admission: RE | Admit: 2015-03-21 | Discharge: 2015-03-21 | Disposition: A | Discharge status: Home / Self Care | Payer: Medicare Other | Source: Ambulatory Visit | Attending: Obstetrics and Gynecology | Admitting: Obstetrics and Gynecology

## 2015-03-21 ENCOUNTER — Ambulatory Visit (HOSPITAL_COMMUNITY): Payer: Medicare Other

## 2015-03-21 ENCOUNTER — Other Ambulatory Visit (HOSPITAL_COMMUNITY): Payer: Self-pay | Admitting: Obstetrics and Gynecology

## 2015-03-21 DIAGNOSIS — O09299 Supervision of pregnancy with other poor reproductive or obstetric history, unspecified trimester: Secondary | ICD-10-CM | POA: Insufficient documentation

## 2015-03-21 DIAGNOSIS — O365990 Maternal care for other known or suspected poor fetal growth, unspecified trimester, not applicable or unspecified: Secondary | ICD-10-CM | POA: Diagnosis present

## 2015-03-21 DIAGNOSIS — O365920 Maternal care for other known or suspected poor fetal growth, second trimester, not applicable or unspecified: Secondary | ICD-10-CM

## 2015-03-21 DIAGNOSIS — O09293 Supervision of pregnancy with other poor reproductive or obstetric history, third trimester: Secondary | ICD-10-CM

## 2015-03-21 DIAGNOSIS — O99340 Other mental disorders complicating pregnancy, unspecified trimester: Secondary | ICD-10-CM | POA: Insufficient documentation

## 2015-03-21 DIAGNOSIS — O99213 Obesity complicating pregnancy, third trimester: Secondary | ICD-10-CM | POA: Diagnosis not present

## 2015-03-21 DIAGNOSIS — Z3A35 35 weeks gestation of pregnancy: Secondary | ICD-10-CM

## 2015-03-21 DIAGNOSIS — O99343 Other mental disorders complicating pregnancy, third trimester: Secondary | ICD-10-CM

## 2015-03-22 LAB — OB RESULTS CONSOLE GBS: STREP GROUP B AG: NEGATIVE

## 2015-03-27 ENCOUNTER — Ambulatory Visit: Payer: Medicare Other | Admitting: Internal Medicine

## 2015-03-28 ENCOUNTER — Encounter (HOSPITAL_COMMUNITY): Payer: Self-pay

## 2015-03-28 ENCOUNTER — Other Ambulatory Visit (HOSPITAL_COMMUNITY): Payer: Self-pay | Admitting: Obstetrics and Gynecology

## 2015-03-28 ENCOUNTER — Ambulatory Visit (HOSPITAL_COMMUNITY)
Admission: RE | Admit: 2015-03-28 | Discharge: 2015-03-28 | Disposition: A | Discharge status: Home / Self Care | Payer: Medicare Other | Source: Ambulatory Visit | Attending: Obstetrics and Gynecology | Admitting: Obstetrics and Gynecology

## 2015-03-28 ENCOUNTER — Ambulatory Visit (HOSPITAL_COMMUNITY): Payer: Medicare Other

## 2015-03-28 DIAGNOSIS — O365920 Maternal care for other known or suspected poor fetal growth, second trimester, not applicable or unspecified: Secondary | ICD-10-CM | POA: Diagnosis not present

## 2015-04-04 ENCOUNTER — Ambulatory Visit (HOSPITAL_COMMUNITY)
Admission: RE | Admit: 2015-04-04 | Discharge: 2015-04-04 | Disposition: A | Discharge status: Home / Self Care | Payer: Medicare Other | Source: Ambulatory Visit | Attending: Obstetrics and Gynecology | Admitting: Obstetrics and Gynecology

## 2015-04-04 ENCOUNTER — Ambulatory Visit (HOSPITAL_COMMUNITY): Payer: Medicare Other

## 2015-04-04 ENCOUNTER — Other Ambulatory Visit (HOSPITAL_COMMUNITY): Payer: Self-pay | Admitting: Maternal and Fetal Medicine

## 2015-04-04 ENCOUNTER — Other Ambulatory Visit (HOSPITAL_COMMUNITY): Payer: Self-pay | Admitting: Obstetrics and Gynecology

## 2015-04-04 DIAGNOSIS — O365930 Maternal care for other known or suspected poor fetal growth, third trimester, not applicable or unspecified: Secondary | ICD-10-CM | POA: Diagnosis not present

## 2015-04-04 DIAGNOSIS — O99213 Obesity complicating pregnancy, third trimester: Secondary | ICD-10-CM | POA: Insufficient documentation

## 2015-04-04 DIAGNOSIS — O09299 Supervision of pregnancy with other poor reproductive or obstetric history, unspecified trimester: Secondary | ICD-10-CM

## 2015-04-04 DIAGNOSIS — O365920 Maternal care for other known or suspected poor fetal growth, second trimester, not applicable or unspecified: Secondary | ICD-10-CM

## 2015-04-04 DIAGNOSIS — Z3A37 37 weeks gestation of pregnancy: Secondary | ICD-10-CM | POA: Insufficient documentation

## 2015-04-04 DIAGNOSIS — O99340 Other mental disorders complicating pregnancy, unspecified trimester: Secondary | ICD-10-CM

## 2015-04-04 DIAGNOSIS — O09293 Supervision of pregnancy with other poor reproductive or obstetric history, third trimester: Secondary | ICD-10-CM | POA: Diagnosis not present

## 2015-04-04 DIAGNOSIS — O99343 Other mental disorders complicating pregnancy, third trimester: Secondary | ICD-10-CM | POA: Insufficient documentation

## 2015-04-10 ENCOUNTER — Telehealth (HOSPITAL_COMMUNITY): Payer: Self-pay | Admitting: *Deleted

## 2015-04-10 ENCOUNTER — Encounter (HOSPITAL_COMMUNITY): Payer: Self-pay | Admitting: *Deleted

## 2015-04-10 NOTE — Telephone Encounter (Signed)
Preadmission screen  

## 2015-04-11 ENCOUNTER — Ambulatory Visit (HOSPITAL_COMMUNITY): Payer: Medicare Other | Attending: Obstetrics and Gynecology

## 2015-04-11 ENCOUNTER — Ambulatory Visit (HOSPITAL_COMMUNITY): Payer: Medicare Other

## 2015-04-12 NOTE — H&P (Signed)
Dorothy Pham is a 29 y.o. female, G2P0100 at 39.0 weeks, presenting for induction of labor due to Small for gestational age infant, AC on 04/09/15 measured at 7%tile . Recommendation per MFM, deliver at 39.0 weeks.  Patient Active Problem List   Diagnosis Date Noted  . Murmur, cardiac 02/16/2015  . DOE (dyspnea on exertion) 02/16/2015  . Schizoaffective disorder 01/30/2015  . Dysplasia of cervix, low grade (CIN 1) 04/21/2012  . Amniotic band syndrome--2013 pregnancy 09/24/2011  . 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 Obesity 09/24/2011    History of present pregnancy: Patient entered care at 6 weeks.   EDC of 04/22/15 was established by LMP and Early US at 6.3wks.    Anatomy scan:  19.1 weeks, with normal findings and an anterior placenta.  Singleton pregnancy, breech presentation, anterior fundal placenta (placental edge is 4.6cm from internal OS-normal. Cervix is closed. Fluid is normal (vertical pocket=4.6cm) Profile/palate, nasal bone not seen. [place]-seen, open hands, 5th digit and feet seen.Female gender. 4 chamber, RVOT, LVOT, Ductal arch, Aortic arch. 3VV. Heel not visualized due to position. Placental cord insertion is not optimally seen.   Additional US evaluations:   8414w3d (Viability/Dates 08/31/14):  Singleton IUP+FHT. Retroflexed uterus. YS. Normal f 6034w5d  F/U Anatomy (12/29/14)  Vertex, Anterior placenta, placental cord insertion seen today. Normal fluid, AP pocket=4.3cm. EFW =21rst%tile. AC lagging. Anatomy f/u: 4ch and AAQ seen today. Both feet seen. Difficult to get right heel at proper angle. Suspicious for club foot vs position. MFM evaluation suggested. Anatomy not seen: RVOT, LVOT, DA.  28 wks MFM: normal anatomy survey but AC < 3%. MFM will follow weekly with dopplers and AFI  30 wks mfm 02/14/15: EFW at 33% but AC<3% BPP 8/8  31 wks  mfm 02/21/15: 3 lbs 6 oz 31% with normal AFI and BPP 8/8. Continue weekly BPP and dopplers and serial  growth  38.4 wks 04/12/15: AFI is 11.9 normal. BPP 8/8 in 15 min. EFW 3172 g, 55%, umbilical artery dopplers: RI is 0.53, 50% S/Dis 2.14, 50%. Appears within normal limits.  Significant prenatal events:      First Semester:   Schizoaffective disorder, stopped taking Ambilify when she became pregnant, per her  Therapists recommendation as it is contraindicated in pregnancy.   Therapist: Coralyn Hellingebecca D. Gray, FNP of  [facility] follows pt's schizoaffective disorder. Ph. [phone].     Second Semester:   Anatomy scan normal, patient has questions about paternity of baby, testing postpartum  Third Trimester: Visit MAU  01/30/15 -C/o SOB with episode of dizziness at 28 wks. Chest Xray Wnl,  Referral  to Cardiology with Dr.Hilty Tressie Ellis/Brady.  Pt was also followed by MFM for decreasing AFI and lagging AC.  C/o  vaginal itching all throughout pregnancy.  Last evaluation:    04/12/15  AStringer, MD  38cm    SVE: 1/20/-3   BP 112/70 OB routine F/U U/S UTD T-dap vaccine. Pt c/o increased BH CT's and cramping pains with continuous brown discharge. Pt denies any recent lab test or hospital visit. pg,cma DOING WELL. READY FOR THE BABY AND INDUCTION ON DEC 5TH. US: SIUP, VERTEX, NORMAL FLUID, BPP 8/8, GROWTH 55%, 7 LBS, NORMAL DOPPLERS. DR. Stefano GaulSTRINGER    OB History    Gravida Para Term Preterm AB TAB SAB Ectopic Multiple Living   2 0 0 0 1 1 0 0 0 0       Obstetric Comments   Fetus with anomalies--induced per patient request  at [facility]     Past Medical History  Diagnosis Date  . Mental disorder   . Schizoaffective disorder   . Complication of anesthesia   . PONV (postoperative nausea and vomiting)   . Schizoaffective disorder   . Heart murmur   . Obesity   . Vaginal Pap smear, abnormal   . Hx of varicella    Past Surgical History  Procedure Laterality Date  . Breast surgery  2012    reduction  . Colposcopy     Family History: family history includes Arthritis in her father and mother;  Bipolar disorder in her mother; Diabetes in her father, maternal grandfather, maternal grandmother, and paternal grandmother; Hypertension in her maternal grandfather and maternal grandmother; Schizophrenia in her paternal grandmother. There is no history of Anesthesia problems. Social History:  reports that she has never smoked. She has never used smokeless tobacco. She reports that she does not drink alcohol or use illicit drugs.  She is an Tree surgeon, [employer] religion, and single/unmarried. In in School for cosmetology.  Prenatal Transfer Tool  Maternal Diabetes: No Genetic Screening: Normal Maternal Ultrasounds/Referrals: Normal Fetal Ultrasounds or other Referrals:  Referred to Materal Fetal Medicine    Maternal Substance Abuse:  No Significant Maternal Medications:  None Significant Maternal Lab Results: Lab values include: Group B Strep negative  TDAP 01/24/15 Flu : declines  ROS:  See above  No Known Allergies     Last menstrual period 07/16/2014.  Chest clear Heart RRR with known murmur Abd gravid, NT, FH 38cm on 04/12/15 Pelvic: 1/20/-3 on 04/12/15 Ext: wnl   FHR: 140 bpm on 04/12/15 UCs:  None noted on 04/12/15  Prenatal labs: ABO, Rh: O/Positive/-- (03/31 0000) Antibody: Negative (03/31 0000) Rubella:  !Error!   immune RPR: Nonreactive (03/31 0000)  HBsAg: Negative (03/31 0000)  HIV: Non-reactive (03/31 0000)  GBS: Negative (11/10 0000) Sickle cell/Hgb electrophoresis:  AA% Pap:  03/07/14 - Cervical intraepithelial neoplasia grade 1, HPV positive GC: negative 03/22/15 Chlamydia:  Negative   03/22/15 Genetic screenings:  Wnl, AFP Glucola:  173 1Hr GTT,  3 hr GTT wnl ,  Other:   TSH, 1.18 (01/30/15), Chest Xray- WNL  (01/30/15) Hgb 11.4 at NOB, 11.2 at 28 weeks   Assessment/Plan: IUP at 39.0 weeks Obesity, BMI=34 GBS negative Schizoaffective disorder -  no meds but under care of therapist at [facility] Heart Murmur, -II/IV systolic  murmur  Small gestational age baby/poor fetal growth Hx of fetal demise at 72 wks, 2013 (Amniotic band syndrome)    Plan: Admit to Birthing Suite per consult with Dr.Rivard Routine [facility] orders Pain med/epidural prn   Beatrix Fetters, MSN 04/12/2015, 3:40 PM

## 2015-04-13 ENCOUNTER — Other Ambulatory Visit: Payer: Self-pay | Admitting: Obstetrics and Gynecology

## 2015-04-15 ENCOUNTER — Encounter (HOSPITAL_COMMUNITY): Payer: Self-pay

## 2015-04-15 ENCOUNTER — Inpatient Hospital Stay (HOSPITAL_COMMUNITY)
Admission: AD | Admit: 2015-04-15 | Discharge: 2015-04-18 | DRG: 775 | Disposition: A | Discharge status: Home / Self Care | Payer: Medicare Other | Source: Ambulatory Visit | Attending: Obstetrics and Gynecology | Admitting: Obstetrics and Gynecology

## 2015-04-15 ENCOUNTER — Inpatient Hospital Stay (HOSPITAL_COMMUNITY): Payer: Medicare Other | Admitting: Anesthesiology

## 2015-04-15 ENCOUNTER — Inpatient Hospital Stay (HOSPITAL_COMMUNITY)
Admission: RE | Admit: 2015-04-15 | Discharge: 2015-04-15 | Disposition: A | Discharge status: Home / Self Care | Payer: Medicare Other | Source: Ambulatory Visit | Attending: Obstetrics and Gynecology | Admitting: Obstetrics and Gynecology

## 2015-04-15 DIAGNOSIS — R030 Elevated blood-pressure reading, without diagnosis of hypertension: Secondary | ICD-10-CM | POA: Diagnosis not present

## 2015-04-15 DIAGNOSIS — E669 Obesity, unspecified: Secondary | ICD-10-CM | POA: Diagnosis present

## 2015-04-15 DIAGNOSIS — F419 Anxiety disorder, unspecified: Secondary | ICD-10-CM | POA: Diagnosis present

## 2015-04-15 DIAGNOSIS — F209 Schizophrenia, unspecified: Secondary | ICD-10-CM | POA: Diagnosis present

## 2015-04-15 DIAGNOSIS — Z3A38 38 weeks gestation of pregnancy: Secondary | ICD-10-CM | POA: Diagnosis not present

## 2015-04-15 DIAGNOSIS — O99344 Other mental disorders complicating childbirth: Secondary | ICD-10-CM | POA: Diagnosis present

## 2015-04-15 DIAGNOSIS — O480 Post-term pregnancy: Secondary | ICD-10-CM | POA: Diagnosis present

## 2015-04-15 DIAGNOSIS — O99214 Obesity complicating childbirth: Secondary | ICD-10-CM | POA: Diagnosis present

## 2015-04-15 DIAGNOSIS — Z6839 Body mass index (BMI) 39.0-39.9, adult: Secondary | ICD-10-CM | POA: Diagnosis not present

## 2015-04-15 DIAGNOSIS — Z3483 Encounter for supervision of other normal pregnancy, third trimester: Secondary | ICD-10-CM | POA: Diagnosis present

## 2015-04-15 DIAGNOSIS — O365930 Maternal care for other known or suspected poor fetal growth, third trimester, not applicable or unspecified: Secondary | ICD-10-CM | POA: Diagnosis present

## 2015-04-15 LAB — CBC
HCT: 32.7 % — ABNORMAL LOW (ref 36.0–46.0)
Hemoglobin: 11 g/dL — ABNORMAL LOW (ref 12.0–15.0)
MCH: 30.6 pg (ref 26.0–34.0)
MCHC: 33.6 g/dL (ref 30.0–36.0)
MCV: 90.8 fL (ref 78.0–100.0)
Platelets: 236 10*3/uL (ref 150–400)
RBC: 3.6 MIL/uL — AB (ref 3.87–5.11)
RDW: 13.7 % (ref 11.5–15.5)
WBC: 7.8 10*3/uL (ref 4.0–10.5)

## 2015-04-15 LAB — RPR: RPR Ser Ql: NONREACTIVE

## 2015-04-15 LAB — TYPE AND SCREEN
ABO/RH(D): O POS
Antibody Screen: NEGATIVE

## 2015-04-15 LAB — ABO/RH: ABO/RH(D): O POS

## 2015-04-15 MED ORDER — FLEET ENEMA 7-19 GM/118ML RE ENEM: 1.0000 | ENEMA | RECTAL | Status: DC | PRN

## 2015-04-15 MED ORDER — LACTATED RINGERS IV SOLN
500.0000 mL | INTRAVENOUS | Status: DC | PRN
  Administered 2015-04-15 (×2): 500 mL via INTRAVENOUS

## 2015-04-15 MED ORDER — OXYTOCIN 40 UNITS IN LACTATED RINGERS INFUSION - SIMPLE MED: 62.5000 mL/h | INTRAVENOUS | Status: DC

## 2015-04-15 MED ORDER — HYDROXYZINE HCL 50 MG PO TABS
50.0000 mg | ORAL_TABLET | Freq: Three times a day (TID) | ORAL | Status: DC | PRN
  Administered 2015-04-15 – 2015-04-16 (×3): 50 mg via ORAL
  Filled 2015-04-15 (×4): qty 1

## 2015-04-15 MED ORDER — OXYTOCIN 40 UNITS IN LACTATED RINGERS INFUSION - SIMPLE MED
1.0000 m[IU]/min | INTRAVENOUS | Status: DC
  Administered 2015-04-15: 1 m[IU]/min via INTRAVENOUS
  Filled 2015-04-15: qty 1000

## 2015-04-15 MED ORDER — DIPHENHYDRAMINE HCL 50 MG/ML IJ SOLN
12.5000 mg | INTRAMUSCULAR | Status: DC | PRN
  Administered 2015-04-16: 12.5 mg via INTRAVENOUS
  Filled 2015-04-15: qty 1

## 2015-04-15 MED ORDER — TERBUTALINE SULFATE 1 MG/ML IJ SOLN
0.2500 mg | Freq: Once | INTRAMUSCULAR | Status: DC | PRN
  Filled 2015-04-15: qty 1

## 2015-04-15 MED ORDER — FENTANYL 2.5 MCG/ML BUPIVACAINE 1/10 % EPIDURAL INFUSION (WH - ANES)
INTRAMUSCULAR | Status: DC | PRN
  Administered 2015-04-15: 14 mL/h via EPIDURAL
  Administered 2015-04-16: 06:00:00

## 2015-04-15 MED ORDER — EPHEDRINE 5 MG/ML INJ
10.0000 mg | INTRAVENOUS | Status: DC | PRN
  Filled 2015-04-15: qty 2

## 2015-04-15 MED ORDER — OXYCODONE-ACETAMINOPHEN 5-325 MG PO TABS: 2.0000 | ORAL_TABLET | ORAL | Status: DC | PRN

## 2015-04-15 MED ORDER — OXYTOCIN BOLUS FROM INFUSION: 500.0000 mL | INTRAVENOUS | Status: DC

## 2015-04-15 MED ORDER — OXYTOCIN 40 UNITS IN LACTATED RINGERS INFUSION - SIMPLE MED: 1.0000 m[IU]/min | INTRAVENOUS | Status: DC

## 2015-04-15 MED ORDER — LIDOCAINE HCL (PF) 1 % IJ SOLN
INTRAMUSCULAR | Status: DC | PRN
  Administered 2015-04-15 (×2): 5 mL

## 2015-04-15 MED ORDER — ACETAMINOPHEN 325 MG PO TABS
650.0000 mg | ORAL_TABLET | ORAL | Status: DC | PRN
  Administered 2015-04-15 (×3): 650 mg via ORAL
  Filled 2015-04-15 (×3): qty 2

## 2015-04-15 MED ORDER — LACTATED RINGERS IV SOLN
INTRAVENOUS | Status: DC
  Administered 2015-04-15 – 2015-04-16 (×5): via INTRAVENOUS

## 2015-04-15 MED ORDER — ONDANSETRON HCL 4 MG/2ML IJ SOLN: 4.0000 mg | Freq: Four times a day (QID) | INTRAMUSCULAR | Status: DC | PRN

## 2015-04-15 MED ORDER — FENTANYL CITRATE (PF) 100 MCG/2ML IJ SOLN
50.0000 ug | INTRAMUSCULAR | Status: DC | PRN
  Administered 2015-04-15 (×4): 100 ug via INTRAVENOUS
  Filled 2015-04-15 (×5): qty 2

## 2015-04-15 MED ORDER — OXYCODONE-ACETAMINOPHEN 5-325 MG PO TABS: 1.0000 | ORAL_TABLET | ORAL | Status: DC | PRN

## 2015-04-15 MED ORDER — CITRIC ACID-SODIUM CITRATE 334-500 MG/5ML PO SOLN: 30.0000 mL | ORAL | Status: DC | PRN

## 2015-04-15 MED ORDER — LIDOCAINE HCL (PF) 1 % IJ SOLN
30.0000 mL | INTRAMUSCULAR | Status: DC | PRN
  Filled 2015-04-15: qty 30

## 2015-04-15 MED ORDER — MISOPROSTOL 25 MCG QUARTER TABLET
25.0000 ug | ORAL_TABLET | ORAL | Status: DC | PRN
  Filled 2015-04-15: qty 1
  Filled 2015-04-15: qty 0.25

## 2015-04-15 MED ORDER — FENTANYL 2.5 MCG/ML BUPIVACAINE 1/10 % EPIDURAL INFUSION (WH - ANES)
14.0000 mL/h | INTRAMUSCULAR | Status: DC | PRN
  Administered 2015-04-16 (×2): 14 mL/h via EPIDURAL
  Filled 2015-04-15 (×4): qty 125

## 2015-04-15 MED ORDER — ZOLPIDEM TARTRATE 5 MG PO TABS
5.0000 mg | ORAL_TABLET | Freq: Every evening | ORAL | Status: DC | PRN
  Administered 2015-04-15: 5 mg via ORAL
  Filled 2015-04-15: qty 1

## 2015-04-15 MED ORDER — PHENYLEPHRINE 40 MCG/ML (10ML) SYRINGE FOR IV PUSH (FOR BLOOD PRESSURE SUPPORT)
80.0000 ug | PREFILLED_SYRINGE | INTRAVENOUS | Status: DC | PRN
  Filled 2015-04-15: qty 2
  Filled 2015-04-15: qty 20

## 2015-04-15 MED ORDER — FENTANYL 2.5 MCG/ML BUPIVACAINE 1/10 % EPIDURAL INFUSION (WH - ANES): 14.0000 mL/h | INTRAMUSCULAR | Status: DC | PRN

## 2015-04-15 NOTE — Progress Notes (Signed)
Dorothy Pham MRN: [id number]  Subjective: -In room to discuss POC for tonight.  Patient reports comfort with epidural, but inability to rest. Requests sleep aide and reports vistaril working well for anxiety.    Objective: BP 125/55 mmHg  Pulse 91  Temp(Src) 98.5 F (36.9 C) (Oral)  Resp 18  Ht 5' 7.5" (1.715 m)  Wt 114.76 kg (253 lb)  BMI 39.02 kg/m2  SpO2 100%  LMP 07/16/2014      Fetal Monitoring: FHT: 135 bpm, Mod Var, -Decels, +Accels UC: Palpates moderate    Vaginal Exam: SVE:  Deferred Membranes:Intact Internal Monitors: None  Augmentation/Induction: Pitocin:4511mUn/min Cytotec: None S/P Foley Bulb  Assessment:  IUP at 39wks Cat I FT  IOL Anxiety  Plan: -Will perform VE at 0030 with possible AROM and insertion of IUPC -Discussed AROM and IUPC r/b including cord prolapse, increased risk of infection, decreased labor interval, and ability to adequately monitor quantity and strength of contractions. -Patient verbalizes understanding and has no questions or concerns -Ambien 5 mg for sleep -Continue other mgmt as ordered  Dorothy CavaJessica L Noretta Frier,MSN, CNM 04/15/2015, 10:34 PM

## 2015-04-15 NOTE — Anesthesia Procedure Notes (Signed)
Epidural Patient location during procedure: OB Start time: 04/15/2015 4:23 PM End time: 04/15/2015 4:41 PM  Staffing Anesthesiologist: Sebastian AcheMANNY, Davaris Youtsey  Preanesthetic Checklist Completed: patient identified, site marked, surgical consent, pre-op evaluation, timeout performed, IV checked, risks and benefits discussed and monitors and equipment checked  Epidural Patient position: sitting Prep: site prepped and draped and DuraPrep Patient monitoring: heart rate, continuous pulse ox and blood pressure Approach: midline Location: L4-L5 Injection technique: LOR air  Needle:  Needle type: Tuohy  Needle gauge: 17 G Needle length: 9 cm and 9 Needle insertion depth: 7.5 cm Catheter type: closed end flexible Catheter size: 20 Guage Catheter at skin depth: 14 cm Test dose: negative  Assessment Events: blood not aspirated, injection not painful, no injection resistance, negative IV test and no paresthesia  Additional Notes   Patient tolerated the insertion well without complications.Reason for block:procedure for pain

## 2015-04-15 NOTE — Progress Notes (Signed)
  Subjective: Comfortable with epidural--"felt it when the catheter went in", but OK now.  Sister at bedside.  Objective: BP 131/97 mmHg  Pulse 91  Temp(Src) 98.5 F (36.9 C) (Oral)  Resp 18  Ht 5' 7.5" (1.715 m)  Wt 114.76 kg (253 lb)  BMI 39.02 kg/m2  SpO2 100%  LMP 07/16/2014      Filed Vitals:   04/15/15 1801 04/15/15 1831 04/15/15 1900 04/15/15 1930  BP: 120/87 125/74 126/78 131/97  Pulse: 92 72 101 91  Temp:    98.5 F (36.9 C)  TempSrc:    Oral  Resp: 18 18  18   Height:      Weight:      SpO2:        FHT: Category 1 UC:   irregular, every 2-6 minutes SVE:  4 cm, 70%, vtx, -3, ballotable, BBOW Pitocin at 9 mu/min  Assessment:  Induction--now in early labor Ballotable position of vtx  Plan: Continue pitocin induction. Await descent of vtx to allow safe AROM. Position to facilitate rotation/descent. Anticipate IUPC with AROM/SROM. Watch BP--consider PCR, PIH labs prn.  Nigel BridgemanLATHAM, Chenika Nevils CNM 04/15/2015, 8:37 PM

## 2015-04-15 NOTE — Progress Notes (Signed)
Dorothy Pham, [id number]   Subjective -Nurse call reports patient present and ready to initiate induction process.  In room to assess.  Patient reports fatigue, but inability to sleep because of "my nerves."  Patient without questions or concerns regarding induction process.  Mother and sister at bedside.   Objective Filed Vitals:   04/15/15 0119 04/15/15 0131 04/15/15 0137 04/15/15 0230  BP:  118/69    Pulse:  87    Temp:  98.2 F (36.8 C)    TempSrc:  Oral    Resp:  16 17 16   Height: 5' 7.5" (1.715 m)     Weight: 114.76 kg (253 lb)      Physical Exam  Constitutional: She is oriented to person, place, and time and well-developed, well-nourished, and in no distress. No distress.  HENT:  Head: Normocephalic and atraumatic.  Eyes: EOM are normal.  Neck: Normal range of motion.  Cardiovascular: Normal rate, regular rhythm and normal heart sounds.   Pulmonary/Chest: Effort normal and breath sounds normal.  Abdominal: Soft.  Musculoskeletal: Normal range of motion. Edema: Non pitting.  Neurological: She is alert and oriented to person, place, and time.  Skin: Skin is warm and dry.    WUJ:[id number]:Dilation: 1 Effacement (%): 50 Cervical Position: Posterior Station: -2 Presentation: Vertex Exam by:: Gerrit HeckJessica Lucas Exline, CNM  Pelvis: -Proven:No, but had previous 19wk delivery -Adequate: Yes Leopolds: -Position:Vertex -EFW:6 1/2 lbs  Monitoring: FHR: 135 bpm, Mod Var, -Decels, +Accels UC:  Palpates moderate  Induction/Augmentation Agent: Pitocin: Initiated Cytotec: NONE Membranes: Intact Foley Bulb Inserted w/o difficulty  Assessment IUP at 39wks Cat I FT Bishop Score: 4 Cervical Ripening; Mechanical Fetal Growth Abnormality-Lagging AC Schizoaffective Disorder Anxiety GBS Negative  Plan -Discussed r/b of induction including fetal distress, serial induction, pain, and increased risk of c/s delivery -Discussed induction methods including cervical ripening agents, foley bulbs,  and pitocin -Questions and concerns addressed -Foley bulb in place until expelled or removed in 12 hours -Start pitocin now and hold at 681mUn/min -Discussed IV pain medications to assist with pain -Will give Vistaril 50mg  TID for anxiety -Continue other mgmt as ordered   Cherre RobinsJessica L Austynn Pridmore, CNM 04/15/2015, 3:39 AM

## 2015-04-15 NOTE — Anesthesia Preprocedure Evaluation (Signed)
Anesthesia Evaluation  Patient identified by MRN, date of birth, ID band Patient awake and Patient confused    Reviewed: Allergy & Precautions, H&P , NPO status , Patient's Chart, lab work & pertinent test results  Airway Mallampati: II       Dental  (+) Teeth Intact, Dental Advidsory Given   Pulmonary    Pulmonary exam normal breath sounds clear to auscultation       Cardiovascular Exercise Tolerance: Good Normal cardiovascular exam Rhythm:regular Rate:Normal     Neuro/Psych ADD, Anxiety   GI/Hepatic   Endo/Other  Morbid obesity  Renal/GU      Musculoskeletal   Abdominal   Peds  Hematology   Anesthesia Other Findings   Reproductive/Obstetrics (+) Pregnancy                             Anesthesia Physical Anesthesia Plan  ASA: III  Anesthesia Plan: Epidural   Post-op Pain Management:    Induction:   Airway Management Planned:   Additional Equipment:   Intra-op Plan:   Post-operative Plan:   Informed Consent: I have reviewed the patients History and Physical, chart, labs and discussed the procedure including the risks, benefits and alternatives for the proposed anesthesia with the patient or authorized representative who has indicated his/her understanding and acceptance.     Plan Discussed with:   Anesthesia Plan Comments:         Anesthesia Quick Evaluation

## 2015-04-15 NOTE — Progress Notes (Signed)
  Subjective: Dozing at intervals, aware of some cramping.  Objective: BP 108/57 mmHg  Pulse 98  Temp(Src) 97.9 F (36.6 C) (Oral)  Resp 18  Ht 5' 7.5" (1.715 m)  Wt 114.76 kg (253 lb)  BMI 39.02 kg/m2  LMP 07/16/2014      FHT: Category 1 UC:   Mild irritability at times SVE:   Dilation: 1 Effacement (%): 50 Station: -2 Exam by:: Gerrit HeckJessica Emly, CNM 0330 Pitocin at 1 mu/min  Assessment:  Induction for AC lag Latent labor Category 1 FHR  Plan: Continue current plan--re-evaluate with foley extrusion, or remove foley at 3:30.  Nigel BridgemanLATHAM, Levi Crass CNM 04/15/2015, 11:07 AM

## 2015-04-15 NOTE — Progress Notes (Signed)
  Subjective: Getting comfortable with epidural.  Mother at bedside.  Objective: BP 120/49 mmHg  Pulse 113  Temp(Src) 99.2 F (37.3 C) (Axillary)  Resp 20  Ht 5' 7.5" (1.715 m)  Wt 114.76 kg (253 lb)  BMI 39.02 kg/m2  SpO2 100%  LMP 07/16/2014      FHT: Category 1 UC:   irregular, every 5-7 minutes SVE:   Dilation: 3 Effacement (%): Thick Station: -3 Exam by:: Emilee HeroLatham, CNM at 3:41p Pitocin at 3 mu/min  Assessment:  Induction for AC lag 2 VC Schizoaffective dx S/p foley bulb out at 3:41p  Plan: Begin to increase pitocin. AROM as appropriate Foley cath now.  Nigel BridgemanLATHAM, Fallou Hulbert CNM 04/15/2015, 5:53 PM

## 2015-04-15 NOTE — Progress Notes (Signed)
  Subjective: Doing well, but does feel moderately anxious.  Mother and sister at bedside.  Patient aware of some cramping--received Fentanyl at 0429 and 0659, and Vistaril po at 0353.  Patient seems more afraid of the pain than is actually in pain.  Objective: BP 129/74 mmHg  Pulse 106  Temp(Src) 97.9 F (36.6 C) (Oral)  Resp 18  Ht 5' 7.5" (1.715 m)  Wt 114.76 kg (253 lb)  BMI 39.02 kg/m2  LMP 07/16/2014     FHT: Category 1 UC:   Mild irritability SVE:   Dilation: 1 Effacement (%): 50 Station: -2 Exam by:: Gerrit HeckJessica Emly, CNM at 0330 Foley bulb placed at that time Pitocin at 1 mu/min for stimulation.  Assessment:  IUP at 39 weeks 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  Plan: Continue current plan--reviewed plan for monitoring FHR continuously, assessing status of Foley bulb, with removal by 3:30pm if not extruded by then, continue pitocin at 1 mu/min, then increase when foley bulb out. Patient will plan epidural as soon as appropriate. Plan SW consult pp due to schizoaffective dx. Dr. Dion BodyVarnado updated.   Dorothy Pham, Dorothy Pham CNM 04/15/2015, 8:29 AM

## 2015-04-15 NOTE — Progress Notes (Signed)
  Subjective: Aware of contractions--received Fentanyl [id number] and 1156.  Objective: BP 121/66 mmHg  Pulse 92  Temp(Src) 98.5 F (36.9 C) (Oral)  Resp 18  Ht 5' 7.5" (1.715 m)  Wt 114.76 kg (253 lb)  BMI 39.02 kg/m2  LMP 07/16/2014      FHT: Category 1 UC:   irregular, every 4-6 minutes SVE:   Dilation: 3 Effacement (%): Thick Station: -3 Exam by:: Emilee HeroLatham, CNM  Foley removed, cervix still very thick. Pitocin at 1 mu/min  Assessment:  Induction for AC lag at 39 weeks Category 1 FHR 2VC  Plan: Reviewed status with patient, including need for patience with induction process. Proceed with epidural, then increase pitocin per protocol. AROM as appropriate with descent of vtx.  Nigel BridgemanLATHAM, Armando Lauman CNM 04/15/2015, 3:41 PM

## 2015-04-16 ENCOUNTER — Encounter (HOSPITAL_COMMUNITY): Payer: Self-pay | Admitting: *Deleted

## 2015-04-16 MED ORDER — CALCIUM CARBONATE ANTACID 500 MG PO CHEW
400.0000 mg | CHEWABLE_TABLET | Freq: Two times a day (BID) | ORAL | Status: DC | PRN
  Administered 2015-04-16: 400 mg via ORAL
  Filled 2015-04-16: qty 2

## 2015-04-16 MED ORDER — OXYCODONE-ACETAMINOPHEN 5-325 MG PO TABS
2.0000 | ORAL_TABLET | ORAL | Status: DC | PRN
  Administered 2015-04-17 (×4): 2 via ORAL
  Filled 2015-04-16 (×4): qty 2

## 2015-04-16 MED ORDER — LACTATED RINGERS IV SOLN
Freq: Once | INTRAVENOUS | Status: AC
  Administered 2015-04-16: 08:00:00 via INTRAUTERINE

## 2015-04-16 MED ORDER — TETANUS-DIPHTH-ACELL PERTUSSIS 5-2.5-18.5 LF-MCG/0.5 IM SUSP: 0.5000 mL | Freq: Once | INTRAMUSCULAR | Status: DC

## 2015-04-16 MED ORDER — PRENATAL MULTIVITAMIN CH
1.0000 | ORAL_TABLET | Freq: Every day | ORAL | Status: DC
  Administered 2015-04-17: 1 via ORAL
  Filled 2015-04-16: qty 1

## 2015-04-16 MED ORDER — WITCH HAZEL-GLYCERIN EX PADS: 1.0000 "application " | MEDICATED_PAD | CUTANEOUS | Status: DC | PRN

## 2015-04-16 MED ORDER — OXYCODONE-ACETAMINOPHEN 5-325 MG PO TABS
1.0000 | ORAL_TABLET | ORAL | Status: DC | PRN
  Administered 2015-04-16 – 2015-04-18 (×2): 1 via ORAL
  Filled 2015-04-16 (×2): qty 1

## 2015-04-16 MED ORDER — BENZOCAINE-MENTHOL 20-0.5 % EX AERO
1.0000 "application " | INHALATION_SPRAY | CUTANEOUS | Status: DC | PRN
  Administered 2015-04-16: 1 via TOPICAL
  Filled 2015-04-16: qty 56

## 2015-04-16 MED ORDER — ONDANSETRON HCL 4 MG PO TABS: 4.0000 mg | ORAL_TABLET | ORAL | Status: DC | PRN

## 2015-04-16 MED ORDER — SENNOSIDES-DOCUSATE SODIUM 8.6-50 MG PO TABS
2.0000 | ORAL_TABLET | ORAL | Status: DC
  Administered 2015-04-16 – 2015-04-17 (×2): 2 via ORAL
  Filled 2015-04-16 (×2): qty 2

## 2015-04-16 MED ORDER — IBUPROFEN 600 MG PO TABS
600.0000 mg | ORAL_TABLET | Freq: Four times a day (QID) | ORAL | Status: DC
  Administered 2015-04-16 – 2015-04-18 (×7): 600 mg via ORAL
  Filled 2015-04-16 (×7): qty 1

## 2015-04-16 MED ORDER — DIPHENHYDRAMINE HCL 25 MG PO CAPS: 25.0000 mg | ORAL_CAPSULE | Freq: Four times a day (QID) | ORAL | Status: DC | PRN

## 2015-04-16 MED ORDER — SIMETHICONE 80 MG PO CHEW: 80.0000 mg | CHEWABLE_TABLET | ORAL | Status: DC | PRN

## 2015-04-16 MED ORDER — ACETAMINOPHEN 325 MG PO TABS
650.0000 mg | ORAL_TABLET | ORAL | Status: DC | PRN
  Administered 2015-04-16: 650 mg via ORAL
  Filled 2015-04-16: qty 2

## 2015-04-16 MED ORDER — DIBUCAINE 1 % RE OINT: 1.0000 "application " | TOPICAL_OINTMENT | RECTAL | Status: DC | PRN

## 2015-04-16 MED ORDER — ONDANSETRON HCL 4 MG/2ML IJ SOLN: 4.0000 mg | INTRAMUSCULAR | Status: DC | PRN

## 2015-04-16 MED ORDER — ZOLPIDEM TARTRATE 5 MG PO TABS
5.0000 mg | ORAL_TABLET | Freq: Every evening | ORAL | Status: DC | PRN
Start: 2015-04-16 — End: 2015-04-18
  Administered 2015-04-17: 5 mg via ORAL
  Filled 2015-04-16: qty 1

## 2015-04-16 MED ORDER — LANOLIN HYDROUS EX OINT: TOPICAL_OINTMENT | CUTANEOUS | Status: DC | PRN

## 2015-04-16 NOTE — Progress Notes (Signed)
Subjective: In room to introduce myself to pt and family. Pt resting in bed.  States she is starting to become more comfortable S/p replacement of epidural tubing replacement.  Reports that she is very tired.  [name] sleeping on bedside couch.   Objective: BP 114/69 mmHg  Pulse 112  Temp(Src) 98 F (36.7 C) (Oral)  Resp 18  Ht 5' 7.5" (1.715 m)  Wt 114.76 kg (253 lb)  BMI 39.02 kg/m2  SpO2 100%  LMP 07/16/2014 I/O last 3 completed shifts: In: -  Out: 1650 [Urine:1650]       FHT: Category  1 , moderate varability, no accel, no decels  UC:   regular, every 4 minutes SVE:   Dilation: 7.5 Effacement (%): 80 Station: -3 Exam by:: J.Emly CNM Pitocin at 2 millinunits/minute Membranes: intact   Assessment:  IUP at 39.1wks Cat I FT  IOL-Pitocin restarted  Anxiety Epidural- pain better controlled  Plan: Will assess and onsider AROM/ IUPC placement when pt more comfortable Continue other management as ordered   Dorothy Pham CNM, [place] 04/16/2015, 7:41 AM

## 2015-04-16 NOTE — Lactation Note (Signed)
This note was copied from the chart of Dorothy Pham. Lactation Consultation Note Initial visit at 4 hours of age.  Baby is in the nursery due to low temperature and respiratory concerns.  Mom reports having a breast reduction in 2012 with whole nipple removed, scar from areola down to mamillary fold at chest and lateral expansion the whole large width of breast tissue.  Discussed with mom how the surgery can affect milk supply, mom does report changes in breast during pregnancy including breast size much larger.  Discussed may need to supplement with formula if medically necessary.  Assisted with hand expression and collected 1 drop to finger feed to baby when mom can.  Hand pump given to help evert nipples for latching and stimulation for lactation.  [facility] resources given and discussed  Early newborn behavior discussed.  Hand expression demonstrated with colostrum visible.  Mom to call for assist as needed.     Patient Name: Dorothy Marshell Levanrinn Boies Today's Date: 04/16/2015 Reason for consult: Initial assessment   Maternal Data Has patient been taught Hand Expression?: Yes Does the patient have breastfeeding experience prior to this delivery?: No  Feeding    LATCH Score/Interventions                      Lactation Tools Discussed/Used WIC Program: Yes Pump Review: Setup, frequency, and cleaning;Milk Storage Initiated by:: JS Date initiated:: 04/16/15   Consult Status Consult Status: Follow-up Date: 04/17/15 Follow-up type: In-patient    Shoptaw, Arvella MerlesJana Lynn 04/16/2015, 6:30 PM

## 2015-04-16 NOTE — Progress Notes (Signed)
Dorothy Pham MRN: [id number]  Subjective: -In room to assess.  Patient c/o itching despite benadryl dosing.    Objective: BP 86/58 mmHg  Pulse 107  Temp(Src) 98.5 F (36.9 C) (Oral)  Resp 18  Ht 5' 7.5" (1.715 m)  Wt 114.76 kg (253 lb)  BMI 39.02 kg/m2  SpO2 100%  LMP 07/16/2014      Fetal Monitoring: FHT: 135 bpm, Mod Var, -Decels, +Accels UC: Palpates mild to moderate    Vaginal Exam: SVE:   Dilation: 5 Effacement (%): 60, 70 Station: Ballotable Exam by:: J.Aiyanah Kalama Membranes:Intact Internal Monitors: None  Augmentation/Induction: Pitocin:2213mUn/min Cytotec: None  Assessment:  IUP at 39.1wks Cat I FT  IOL-Pitocin Ballotable Station Itching  Plan: -Discussed fetal station and inability for AROM currently due to increased risk of cord prolapse. -Will repeat benadryl, in 15 minutes, if necessary for itching -Will reassess in 4 hours or prn -Continue other mgmt as ordered  Valma CavaJessica L Murrell Elizondo,MSN, CNM 04/16/2015, 12:44 AM

## 2015-04-16 NOTE — Progress Notes (Signed)
Subjective: Pt resting and comfortable with epidural.  States she is feeling increased pressure but does not feel the urge to push. Pt has utilized PCA bolus's with epidural and has a very dense epidural.   Objective: BP 89/67 mmHg  Pulse 81  Temp(Src) 97.8 F (36.6 C) (Oral)  Resp 18  Ht 5' 7.5" (1.715 m)  Wt 114.76 kg (253 lb)  BMI 39.02 kg/m2  SpO2 99%  LMP 07/16/2014 I/O last 3 completed shifts: In: -  Out: 1650 [Urine:1650] Total I/O In: -  Out: 2200 [Urine:2200]  FHT: Category 1 -2  moderate variability, early decelerations and occasional prolonged decelerations  UC:   regular, every 1.5-2.5  Minutes,  SVE:   Dilation: 10 Effacement (%): 100 Station: +1, +2 Exam by:: Alphonzo Severanceachel Briyana Badman, CNM Pitocin at 6 milliuntis/min Internal Monitor: IUPC MVUs  185   Membranes: AROM at 0800 Amnio infusion  Pain management:  Epidural  Assessment:  IUP at 39.1wks Overall Cat I FT , with occasional periods of Cat 2 IOL-Pitocin AROM x 3 hrs Amnio infusion Anxiety Epidural  Plan: Labor Down for 1/2 hr Will begin trial pushes after labor down Anticipate NSVD Dr. Estanislado Pandyivard available        Alphonzo Severanceachel Tanija Germani CNM, MSN 04/16/2015, 10:57 AM

## 2015-04-16 NOTE — Progress Notes (Signed)
Dorothy Pham MRN: [id number]  Subjective: -Nurse call reports patient s/p prolonged deceleration and reporting rectal pressure.  In room to assess.  Patient on right side.  C/O pain and appears restless.   Objective: BP 121/64 mmHg  Pulse 74  Temp(Src) 98.5 F (36.9 C) (Oral)  Resp 18  Ht 5' 7.5" (1.715 m)  Wt 114.76 kg (253 lb)  BMI 39.02 kg/m2  SpO2 100%  LMP 07/16/2014   Total I/O In: -  Out: 1650 [Urine:1650]  Fetal Monitoring: FHT: 135 bpm, Mod Var, +Prolonged Decel, +Accels UC: palpates moderate    Vaginal Exam: SVE:   Dilation: 6 Effacement (%): 70 Station: Ballotable Exam by:: State Street CorporationEmly, CNM Membranes:Bulging Internal Monitors: None  Augmentation/Induction: Pitocin:Decreased from 2714mUn/min to 3910mUn/min Cytotec: None S/P Foley Bulb  Assessment:  IUP at 39.1wks Cat II FT  IOL-Pitocin Anxiety  Plan: -Position change and decrease in pitocin results in resolution of Cat II FT -Patient informed that due to fetal station will allow for SROM  -Placed on peanut to promote fetal descent and rotation -Nurse encouraged to offer dose of vistaril as patient displaying some increased anxiety -Continue other mgmt as ordered   Dorothy CavaJessica L Aalaya Yadao,MSN, CNM 04/16/2015, 3:22 AM

## 2015-04-16 NOTE — Progress Notes (Signed)
Dorothy Pham is a 29 y.o. G2P0010 at 3339w1dadmitted for induction of labor due to SGA and 2 Vx cord.  Subjective:  Comfortable with  Epidural Contractions every 3-5 minutes, lasting 45-60 seconds Pitocin off since 5:00 for NRFHR Now Category 1 with difficulty evaluating contractions.    Objective: BP 129/63 mmHg  Pulse 67  Temp(Src) 97.8 F (36.6 C) (Oral)  Resp 18  Ht 5' 7.5" (1.715 m)  Wt 253 lb (114.76 kg)  BMI 39.02 kg/m2  SpO2 100%  LMP 07/16/2014 I/O last 3 completed shifts: In: -  Out: 1650 [Urine:1650] Total I/O In: -  Out: 2200 [Urine:2200]  AROM at 8:06 with clear fluid Post AROM: FHR baseline 120-130 with minimal variability and repetitive early decelerations Amnioinfusion stated with improvement of tracings Rechecked cervix 1 hour after AROM: unchanged  SVE:   Dilation: 9 Effacement (%): 90 Station: 0 Exam by:: Anvi Mangal, MD  Labs: Lab Results  Component Value Date   WBC 7.8 04/15/2015   HGB 11.0* 04/15/2015   HCT 32.7* 04/15/2015   MCV 90.8 04/15/2015   PLT 236 04/15/2015    Assessment / Plan: Augmentation of labor, progressing well but with infrequent contractions Will increase Pitocin for adequate contractions Fetal Wellbeing: reassuring Anticipated MOD:  NSVD  Angela Platner A 04/16/2015, 9:13 AM

## 2015-04-16 NOTE — Progress Notes (Signed)
Dorothy Pham MRN: [id number]  Subjective: -Patient resting in bed. Reports epidural catheter had broken and was just fixed.  States not entirely comfortable yet.    Objective: BP 131/77 mmHg  Pulse 100  Temp(Src) 98 F (36.7 C) (Oral)  Resp 18  Ht 5' 7.5" (1.715 m)  Wt 114.76 kg (253 lb)  BMI 39.02 kg/m2  SpO2 100%  LMP 07/16/2014 I/O last 3 completed shifts: In: -  Out: 1650 [Urine:1650]    Fetal Monitoring: FHT: 135 bpm, Mod Var, -Decels, +Accels UC: Palpates Moderate    Vaginal Exam: SVE:   Dilation: 7.5 Effacement (%): 80 Station: -3 Exam by:: J.Philbert Ocallaghan CNM Membranes:Bulging Internal Monitors: None  Augmentation/Induction: Pitocin:Restarted at 542mUn/min Cytotec: None  Assessment:  IUP at 39.1wks Cat I FT  IOL-Pitocin Anxiety Pain Uncontrolled  Plan: -Discussed AROM and patient refuses stating she can feel VE and would like to wait until more numb -Will restart pitocin at 612mUn/min and hold -Continue other mgmt as ordered -Report given to oncoming provider, R. Stall, CNM  -Dr. Carroll SageE. Varnado updated on patient status  Dorothy CopaJessica L Johnedward Brodrick,MSN, CNM 04/16/2015, 7:17 AM

## 2015-04-17 LAB — CBC
HCT: 29.4 % — ABNORMAL LOW (ref 36.0–46.0)
HEMATOCRIT: 28.9 % — AB (ref 36.0–46.0)
Hemoglobin: 9.8 g/dL — ABNORMAL LOW (ref 12.0–15.0)
Hemoglobin: 9.9 g/dL — ABNORMAL LOW (ref 12.0–15.0)
MCH: 30.5 pg (ref 26.0–34.0)
MCH: 30.3 pg (ref 26.0–34.0)
MCHC: 33.9 g/dL (ref 30.0–36.0)
MCHC: 33.7 g/dL (ref 30.0–36.0)
MCV: 90 fL (ref 78.0–100.0)
MCV: 89.9 fL (ref 78.0–100.0)
PLATELETS: 220 10*3/uL (ref 150–400)
Platelets: 201 10*3/uL (ref 150–400)
RBC: 3.21 MIL/uL — ABNORMAL LOW (ref 3.87–5.11)
RBC: 3.27 MIL/uL — AB (ref 3.87–5.11)
RDW: 13.6 % (ref 11.5–15.5)
RDW: 13.7 % (ref 11.5–15.5)
WBC: 15.6 10*3/uL — ABNORMAL HIGH (ref 4.0–10.5)
WBC: 13 10*3/uL — ABNORMAL HIGH (ref 4.0–10.5)

## 2015-04-17 LAB — COMPREHENSIVE METABOLIC PANEL
ALT: 22 U/L (ref 14–54)
ANION GAP: 5 (ref 5–15)
AST: 34 U/L (ref 15–41)
Albumin: 2.4 g/dL — ABNORMAL LOW (ref 3.5–5.0)
Alkaline Phosphatase: 74 U/L (ref 38–126)
BUN: 12 mg/dL (ref 6–20)
CHLORIDE: 109 mmol/L (ref 101–111)
CO2: 24 mmol/L (ref 22–32)
Calcium: 9.9 mg/dL (ref 8.9–10.3)
Creatinine, Ser: 0.72 mg/dL (ref 0.44–1.00)
GFR calc non Af Amer: >60 mL/min (ref >60)
Glucose, Bld: 77 mg/dL (ref 65–99)
POTASSIUM: 3.9 mmol/L (ref 3.5–5.1)
SODIUM: 138 mmol/L (ref 135–145)
Total Bilirubin: 0.2 mg/dL — ABNORMAL LOW (ref 0.3–1.2)
Total Protein: 5.4 g/dL — ABNORMAL LOW (ref 6.5–8.1)

## 2015-04-17 LAB — PROTEIN / CREATININE RATIO, URINE: CREATININE, URINE: 51 mg/dL

## 2015-04-17 LAB — LACTATE DEHYDROGENASE: LDH: 174 U/L (ref 98–192)

## 2015-04-17 LAB — URIC ACID: URIC ACID, SERUM: 5.1 mg/dL (ref 2.3–6.6)

## 2015-04-17 MED ORDER — LABETALOL HCL 5 MG/ML IV SOLN: 20.0000 mg | INTRAVENOUS | Status: DC | PRN

## 2015-04-17 MED ORDER — LORAZEPAM 1 MG PO TABS
0.5000 mg | ORAL_TABLET | Freq: Once | ORAL | Status: AC
  Administered 2015-04-17: 0.5 mg via ORAL
  Filled 2015-04-17: qty 1

## 2015-04-17 MED ORDER — FERROUS SULFATE 325 (65 FE) MG PO TABS
325.0000 mg | ORAL_TABLET | Freq: Two times a day (BID) | ORAL | Status: DC
  Administered 2015-04-17 – 2015-04-18 (×3): 325 mg via ORAL
  Filled 2015-04-17 (×3): qty 1

## 2015-04-17 NOTE — Progress Notes (Signed)
CSW attempted to meet with MOB, but she was not in her room at this time.  CSW will attempt again at a later time. 

## 2015-04-17 NOTE — Lactation Note (Signed)
This note was copied from the chart of Dorothy Susen Christy. Lactation Consultation Note  Initial visit made.  Breastfeeding consultation services and support information given to patient.  Mom states baby just finished a 20 minute feeding.  She states baby is latching easily and nursing well.  C/o nipple soreness.  Nipples are intact and nipples pink.  Reviewed importance of latching with wide mouth for deep latch.  She denies any concerns or questions at present.  Instructed to feed baby with any feeding cue and to call with concerns or latch assist prn.  Patient Name: Dorothy Pham Today's Date: 04/17/2015 Reason for consult: Initial assessment   Maternal Data Formula Feeding for Exclusion: Yes Reason for exclusion: Admission to Intensive Care Unit (ICU) post-partum Does the patient have breastfeeding experience prior to this delivery?: No  Feeding Feeding Type: Formula Length of feed: 30 min  LATCH Score/Interventions                      Lactation Tools Discussed/Used     Consult Status Consult Status: Follow-up Date: 04/18/15 Follow-up type: In-patient    Huston FoleyMOULDEN, [place] Hester S 04/17/2015, 2:50 PM

## 2015-04-17 NOTE — Progress Notes (Signed)
Set patient up with DEBP. Instructed her to pump for 15 minutes.

## 2015-04-17 NOTE — Progress Notes (Signed)
CSW attempted again to meet with MOB, but she again was not in her room.  MOB visiting at bedside in NICU.  CSW will attempt to meet with her when she returns to her room.

## 2015-04-17 NOTE — Progress Notes (Signed)
CSW met with MOB in her first floor room/109 to introduce services, offer support, and complete assessment due to baby's admission to NICU and maternal dx of Schizoaffective Disorder.  CSW did not complete full assessment at this time since MOB was being updated about baby's potential transfer to [facility] for ENT consult.  MGM was present with MOB and MOB gave permission to speak with her there.  MOB appears to be coping very well and states she is taking things one moment at a time.  She reviewed the POC with MD and then again with CSW after MD left and appears to have a good understanding.  MOB takes notes when given information.  MOB reports having a great support system through her family.  She states she has all necessary baby supplies for infant at home.  She lives alone, but MGM and sisters live near by.  MOB states she receives Disability, but did not elaborate on this.  CSW did not discuss MOB's Schizoaffective Disorder at this time and will address in the morning at follow up visit if it feels appropriate.  CSW explained support offered by NICU CSW and MOB seemed very appreciative.  CSW informed MOB of [facility] resource in [place] if baby is transferred.  MOB and MGM both very appreciative and interested.  MOB states she has her own transportation and will be able to drive to [facility] with baby tomorrow.  MOB seems eager to leave the hospital when baby does and hopes to be discharged prior to baby's transport, which is tentatively scheduled for mid morning on 04/18/15.  CSW informed MOB's bedside RN of this. 

## 2015-04-17 NOTE — Progress Notes (Addendum)
Subjective: Postpartum Day 1: Vaginal delivery, right labial laceration Patient up ad lib, reports occasional dizziness, denies HA, blurry vision epigastric pain Feeding:  Pumping Contraceptive plan:  Undecided - considering OCP.   Objective: Vital signs in last 24 hours: Temp:  [97.7 F (36.5 C)-98.5 F (36.9 C)] 98.5 F (36.9 C) (12/06 1738) Pulse Rate:  [75-85] 85 (12/06 1740) Resp:  [18] 18 (12/06 1738) BP: (130-144)/(67-102) 144/102 mmHg (12/06 1740) SpO2:  [99 %] 99 % (12/06 0400)  Physical Exam:  General: alert and cooperative Lochia: appropriate Uterine Fundus: firm Perineum: healing well DVT Evaluation: No evidence of DVT seen on physical exam. Negative Homan's sign. Calf/Ankle edema is present. DTR - Hyper-reflexic, No clonus   CBC Latest Ref Rng 04/17/2015 04/15/2015 03/05/2015  WBC 4.0 - 10.5 K/uL 15.6(H) 7.8 10.3  Hemoglobin 12.0 - 15.0 g/dL 0.9(W9.8(L) 11.0(L) 11.0(L)  Hematocrit 36.0 - 46.0 % 28.9(L) 32.7(L) 32.7(L)  Platelets 150 - 400 K/uL 201 236 250     Filed Vitals:   04/17/15 1738 04/17/15 1740 04/17/15 1820 04/17/15 1835  BP:  144/102 127/62 124/70  Pulse:  85 79 71  Temp: 98.5 F (36.9 C)     TempSrc: Oral     Resp: 18     Height:      Weight:      SpO2:          Assessment/Plan: Status post vaginal delivery day 1. Elevated BP PIH labs Continue current care.   Beatrix FettersRachel StallCNM 04/17/2015, 6:26 PM

## 2015-04-17 NOTE — Progress Notes (Signed)
UR chart review completed.  

## 2015-04-17 NOTE — Lactation Note (Signed)
This note was copied from the chart of Dorothy Pham. Lactation Consultation Note  Follow up note.  Baby was transferred to NICU for respiratory distress.  Mom has DEBP set up at bedside but not pumping consistently.  She states she put baby to breast this AM for a few minutes and was able to hand express colostrum.  She has a history of a breast reduction so milk supply will need to be monitored.  Reviewed supply and demand and importance of pumping every 3 hours.  [facility] and [facility]ed.  Patient Name: Dorothy Pham VOZDG'UToday's Date: 04/17/2015     Maternal Data    Feeding    LATCH Score/Interventions                      Lactation Tools Discussed/Used     Consult Status      Huston FoleyMOULDEN, Dorothy Emory S 04/17/2015, 12:21 PM

## 2015-04-17 NOTE — Anesthesia Postprocedure Evaluation (Signed)
Anesthesia Post Note  Patient: Dorothy Pham  Procedure(s) Performed: * No procedures listed *  Patient location during evaluation: Mother Baby Anesthesia Type: Epidural Level of consciousness: awake and alert Pain management: pain level controlled Vital Signs Assessment: post-procedure vital signs reviewed and stable Respiratory status: spontaneous breathing Cardiovascular status: stable Postop Assessment: no headache, no backache, epidural receding, patient able to bend at knees and no signs of nausea or vomiting Anesthetic complications: no    Last Vitals:  Filed Vitals:   04/16/15 2035 04/17/15 0400  BP: 130/67 133/73  Pulse: 81 75  Temp: 36.8 C 36.5 C  Resp: 18 18    Last Pain:  Filed Vitals:   04/17/15 0555  PainSc: 7                  Edison PaceWILKERSON,Deshawn Skelley

## 2015-04-17 NOTE — Addendum Note (Signed)
Addendum  created 04/17/15 0830 by Earmon PhoenixValerie P Deen Deguia, CRNA   Modules edited: Charges VN, Clinical Notes   Clinical Notes:  File: [id number]

## 2015-04-18 ENCOUNTER — Encounter (HOSPITAL_COMMUNITY): Payer: Self-pay | Admitting: Obstetrics and Gynecology

## 2015-04-18 ENCOUNTER — Ambulatory Visit (HOSPITAL_COMMUNITY): Payer: Medicare Other

## 2015-04-18 MED ORDER — IBUPROFEN 600 MG PO TABS: 600.0000 mg | ORAL_TABLET | Freq: Four times a day (QID) | ORAL | Status: DC | PRN

## 2015-04-18 MED ORDER — PRENATAL MULTIVITAMIN CH: 1.0000 | ORAL_TABLET | Freq: Every day | ORAL | Status: DC

## 2015-04-18 MED ORDER — OXYCODONE-ACETAMINOPHEN 5-325 MG PO TABS: 1.0000 | ORAL_TABLET | ORAL | Status: DC | PRN

## 2015-04-18 NOTE — Lactation Note (Signed)
This note was copied from the chart of Dorothy Pham. Lactation Consultation Note  Follow up visit made.  Mom has been only pumping one breast at a time due to difficulty holding both.  Assisted mom with how to double pump and she was able position and hold flanges in place.  Small amount of colostrum visible on breast after pumping and with hand expression but not enough to collect.  We discussed normal milk coming to volume and monitoring supply due to breast reduction.  WIC is to call her this AM about pump for discharge.  Pump loaner program also reviewed.  Will follow up later today.  Patient Name: Dorothy Marshell Levanrinn Casa ZOXWR'UToday's Date: 04/18/2015     Maternal Data    Feeding Feeding Type: Formula Length of feed: 30 min  LATCH Score/Interventions                      Lactation Tools Discussed/Used     Consult Status      Huston FoleyMOULDEN, Chantrell Apsey S 04/18/2015, 11:15 AM

## 2015-04-18 NOTE — Discharge Instructions (Signed)
Postpartum Care After Vaginal Delivery °After you deliver your newborn (postpartum period), the usual stay in the hospital is 24-72 hours. If there were problems with your labor or delivery, or if you have other medical problems, you might be in the hospital longer.  °While you are in the hospital, you will receive help and instructions on how to care for yourself and your newborn during the postpartum period.  °While you are in the hospital: °· Be sure to tell your nurses if you have pain or discomfort, as well as where you feel the pain and what makes the pain worse. °· If you had an incision made near your vagina (episiotomy) or if you had some tearing during delivery, the nurses may put ice packs on your episiotomy or tear. The ice packs may help to reduce the pain and swelling. °· If you are breastfeeding, you may feel uncomfortable contractions of your uterus for a couple of weeks. This is normal. The contractions help your uterus get back to normal size. °· It is normal to have some bleeding after delivery. °· For the first 1-3 days after delivery, the flow is red and the amount may be similar to a period. °· It is common for the flow to start and stop. °· In the first few days, you may pass some small clots. Let your nurses know if you begin to pass large clots or your flow increases. °· Do not  flush blood clots down the toilet before having the nurse look at them. °· During the next 3-10 days after delivery, your flow should become more watery and pink or brown-tinged in color. °· Ten to fourteen days after delivery, your flow should be a small amount of yellowish-white discharge. °· The amount of your flow will decrease over the first few weeks after delivery. Your flow may stop in 6-8 weeks. Most women have had their flow stop by 12 weeks after delivery. °· You should change your sanitary pads frequently. °· Wash your hands thoroughly with soap and water for at least 20 seconds after changing pads, using  the toilet, or before holding or feeding your newborn. °· You should feel like you need to empty your bladder within the first 6-8 hours after delivery. °· In case you become weak, lightheaded, or faint, call your nurse before you get out of bed for the first time and before you take a shower for the first time. °· Within the first few days after delivery, your breasts may begin to feel tender and full. This is called engorgement. Breast tenderness usually goes away within 48-72 hours after engorgement occurs. You may also notice milk leaking from your breasts. If you are not breastfeeding, do not stimulate your breasts. Breast stimulation can make your breasts produce more milk. °· Spending as much time as possible with your newborn is very important. During this time, you and your newborn can feel close and get to know each other. Having your newborn stay in your room (rooming in) will help to strengthen the bond with your newborn.  It will give you time to get to know your newborn and become comfortable caring for your newborn. °· Your hormones change after delivery. Sometimes the hormone changes can temporarily cause you to feel sad or tearful. These feelings should not last more than a few days. If these feelings last longer than that, you should talk to your caregiver. °· If desired, talk to your caregiver about methods of family planning or contraception. °·   Talk to your caregiver about immunizations. Your caregiver may want you to have the following immunizations before leaving the hospital: °· Tetanus, diphtheria, and pertussis (Tdap) or tetanus and diphtheria (Td) immunization. It is very important that you and your family (including grandparents) or others caring for your newborn are up-to-date with the Tdap or Td immunizations. The Tdap or Td immunization can help protect your newborn from getting ill. °· Rubella immunization. °· Varicella (chickenpox) immunization. °· Influenza immunization. You should  receive this annual immunization if you did not receive the immunization during your pregnancy. °  °This information is not intended to replace advice given to you by your health care provider. Make sure you discuss any questions you have with your health care provider. °  °Document Released: 02/23/2007 Document Revised: 01/21/2012 Document Reviewed: 12/24/2011 °Elsevier Interactive Patient Education ©2016 [employer] ° °Iron Deficiency Anemia, Adult °Anemia is a condition in which there are less red blood cells or hemoglobin in the blood than normal. Hemoglobin is the part of red blood cells that carries oxygen. Iron deficiency anemia is anemia caused by too little iron. It is the most common type of anemia. It may leave you tired and short of breath. °CAUSES  °· Lack of iron in the diet. °· Poor absorption of iron, as seen with intestinal disorders. °· Intestinal bleeding. °· Heavy periods. °SIGNS AND SYMPTOMS  °Mild anemia may not be noticeable. Symptoms may include: °· Fatigue. °· Headache. °· Pale skin. °· Weakness. °· Tiredness. °· Shortness of breath. °· Dizziness. °· Cold hands and feet. °· Fast or irregular heartbeat. °DIAGNOSIS  °Diagnosis requires a thorough evaluation and physical exam by your health care provider. Blood tests are generally used to confirm iron deficiency anemia. Additional tests may be done to find the underlying cause of your anemia. These may include: °· Testing for blood in the stool (fecal occult blood test). °· A procedure to see inside the colon and rectum (colonoscopy). °· A procedure to see inside the esophagus and stomach (endoscopy). °TREATMENT  °Iron deficiency anemia is treated by correcting the cause of the deficiency. Treatment may involve: °· Adding iron-rich foods to your diet. °· Taking iron supplements. Pregnant or breastfeeding women need to take extra iron because their normal diet usually does not provide the required amount. °· Taking vitamins. Vitamin C improves  the absorption of iron. Your health care provider may recommend that you take your iron tablets with a glass of orange juice or vitamin C supplement. °· Medicines to make heavy menstrual flow lighter. °· Surgery. °HOME CARE INSTRUCTIONS  °· Take iron as directed by your health care provider. °¨ If you cannot tolerate taking iron supplements by mouth, talk to your health care provider about taking them through a vein (intravenously) or an injection into a muscle. °¨ For the best iron absorption, iron supplements should be taken on an empty stomach. If you cannot tolerate them on an empty stomach, you may need to take them with food. °¨ Do not drink milk or take antacids at the same time as your iron supplements. Milk and antacids may interfere with the absorption of iron. °¨ Iron supplements can cause constipation. Make sure to include fiber in your diet to prevent constipation. A stool softener may also be recommended. °· Take vitamins as directed by your health care provider. °· Eat a diet rich in iron. Foods high in iron include liver, lean beef, whole-grain bread, eggs, dried fruit, and dark green leafy vegetables. °SEEK IMMEDIATE   MEDICAL CARE IF:  °· You faint. If this happens, do not drive. Call your local emergency services (911 in [place] if no other help is available. °· You have chest pain. °· You feel nauseous or vomit. °· You have severe or increased shortness of breath with activity. °· You feel weak. °· You have a rapid heartbeat. °· You have unexplained sweating. °· You become light-headed when getting up from a chair or bed. °MAKE SURE YOU:  °· Understand these instructions. °· Will watch your condition. °· Will get help right away if you are not doing well or get worse. °  °This information is not intended to replace advice given to you by your health care provider. Make sure you discuss any questions you have with your health care provider. °  °Document Released: 04/25/2000 Document Revised:  05/19/2014 Document Reviewed: 01/03/2013 °Elsevier Interactive Patient Education ©2016 Elsevier Inc. ° °

## 2015-04-18 NOTE — Discharge Summary (Addendum)
OB Discharge Summary     Patient Name: Dorothy Pham DOB: 01/03/1986 MRN: [id number]  Date of admission: 04/15/2015 Delivering MD: Alphonzo SeveranceSTALL, RACHEL   Date of discharge: 05/05/2015  Admitting diagnosis: 38wks, induction Intrauterine pregnancy: 3631w1d     Secondary diagnosis:  Principal Problem:   Spontaneous vaginal delivery Active Problems:   Post term pregnancy, antepartum condition or complication  Patient Active Problem List   Diagnosis Date Noted  . Spontaneous vaginal delivery 04/16/2015  . Post term pregnancy, antepartum condition or complication 04/15/2015  . Murmur, cardiac 02/16/2015  . DOE (dyspnea on exertion) 02/16/2015  . Schizoaffective disorder 01/30/2015  . Dysplasia of cervix, low grade (CIN 1) 04/21/2012  . Amniotic band syndrome--2013 pregnancy 09/24/2011  . 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 Left nasal atresia)--plans genetic w/u     Discharge diagnosis: Term Pregnancy Delivered                                                                                                Post partum procedures:PIH labs, all WNL--done for single elevated BP  Augmentation: AROM, Pitocin and Foley Balloon  Complications: None  Hospital course:  Induction of Labor With Vaginal Delivery   29 y.o. yo G2P1011 at 6131w1d was admitted to the hospital 04/15/2015 for induction of labor.  Indication for induction: SGA, AC lag.  Patient had an uncomplicated labor course as follows: Membrane Rupture Time/Date: 8:00 AM ,04/16/2015   Intrapartum Procedures: Episiotomy: None                                        Lacerations:  Labial [10];1st degree [2]  Patient had delivery of a Viable infant.  Information for the patient's newborn:  Carmin RichmondBurress, Amira Mone't [[id number]][[id number]]  Delivery Method: Vaginal, Spontaneous Delivery (Filed from Delivery Summary)     04/18/2015  Details of delivery can be found in separate delivery note.  Patient had a routine postpartum course. Had PIH labs and PCR done for single elevated BP pp, all WNL, and normotensive.  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  [facility] but that is on hold at the time of patient's d/c.  Patient coping well, with SW consult obtained on day 1, with no barriers to d/c.  Patient has f/u visit planned with psychiatrist in next 1-2 2weeks--encouraged to f/u with that appt to get started back on her meds for schizoaffective dx. Patient is discharged home on 04/18/15.    Physical exam  Filed Vitals:   04/17/15 1930 04/17/15 1952 04/17/15 2135 04/18/15 0715  BP: 123/71 132/72 112/74 116/65  Pulse:    76  Temp:    98.3 F (36.8 C)  TempSrc:    Oral  Resp:    20  Height:      Weight:  SpO2:       General: alert Lochia: appropriate Uterine Fundus: firm Incision: Healing well with no significant drainage DVT Evaluation: No evidence of DVT seen on physical exam. Negative Homan's sign. Labs: CBC Latest Ref Rng 04/17/2015 04/17/2015 04/15/2015  WBC 4.0 - 10.5 K/uL 13.0(H) 15.6(H) 7.8  Hemoglobin 12.0 - 15.0 g/dL 9.6(E) 4.5(W) 11.0(L)  Hematocrit 36.0 - 46.0 % 29.4(L) 28.9(L) 32.7(L)  Platelets 150 - 400 K/uL 220 201 236    CMP Latest Ref Rng 04/17/2015  Glucose 65 - 99 mg/dL 77  BUN 6 - 20 mg/dL 12  Creatinine 0.98 - 1.19 mg/dL 1.47  Sodium 829 - 562 mmol/L 138  Potassium 3.5 - 5.1 mmol/L 3.9  Chloride 101 - 111 mmol/L 109  CO2 22 - 32 mmol/L 24  Calcium 8.9 - 10.3 mg/dL 9.9  Total Protein 6.5 - 8.1 g/dL 1.3(Y)  Total Bilirubin 0.3 - 1.2 mg/dL 8.6(V)  Alkaline Phos 38 - 126 U/L 74  AST 15 - 41 U/L 34  ALT 14 - 54 U/L 22    Discharge instruction: per After Visit Summary and "Baby and Me Booklet".  After visit meds:    Medication List    TAKE these  medications        ferrous sulfate 325 (65 FE) MG tablet  Take 1 tablet (325 mg total) by mouth daily.     ibuprofen 600 MG tablet  Commonly known as:  ADVIL,MOTRIN  Take 1 tablet (600 mg total) by mouth every 6 (six) hours as needed.     oxyCODONE-acetaminophen 5-325 MG tablet  Commonly known as:  PERCOCET/ROXICET  Take 1 tablet by mouth every 4 (four) hours as needed (for pain scale 4-7).     PNV PRENATAL PLUS MULTIVITAMIN 27-1 MG Tabs  Take 1 tablet by mouth daily.     prenatal multivitamin Tabs tablet  Take 1 tablet by mouth daily at 12 noon.        Diet: routine diet  Activity: Advance as tolerated. Pelvic rest for 6 weeks.   Outpatient follow up:6 weeks with [facility] and  Follow up Appt:No future appointments. Follow up Visit:No Follow-up on file.  Postpartum contraception: Undecided at present--understands options.  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  Per patient, baby improved overnight, so transfer plan is undecided at the time of mother's d/c.     05/05/2015 Nigel Bridgeman, CNM

## 2015-05-05 ENCOUNTER — Encounter (HOSPITAL_COMMUNITY): Payer: Self-pay | Admitting: Obstetrics and Gynecology

## 2015-05-20 ENCOUNTER — Encounter (HOSPITAL_COMMUNITY): Payer: Self-pay | Admitting: *Deleted

## 2015-05-20 ENCOUNTER — Inpatient Hospital Stay (HOSPITAL_COMMUNITY)
Admission: EM | Admit: 2015-05-20 | Discharge: 2015-05-20 | Disposition: A | Discharge status: Home / Self Care | Payer: Medicare Other | Attending: Obstetrics and Gynecology | Admitting: Obstetrics and Gynecology

## 2015-05-20 DIAGNOSIS — M549 Dorsalgia, unspecified: Secondary | ICD-10-CM | POA: Insufficient documentation

## 2015-05-20 DIAGNOSIS — R011 Cardiac murmur, unspecified: Secondary | ICD-10-CM | POA: Insufficient documentation

## 2015-05-20 DIAGNOSIS — R300 Dysuria: Secondary | ICD-10-CM | POA: Diagnosis present

## 2015-05-20 DIAGNOSIS — F259 Schizoaffective disorder, unspecified: Secondary | ICD-10-CM | POA: Diagnosis not present

## 2015-05-20 DIAGNOSIS — R5383 Other fatigue: Secondary | ICD-10-CM | POA: Insufficient documentation

## 2015-05-20 DIAGNOSIS — E669 Obesity, unspecified: Secondary | ICD-10-CM | POA: Diagnosis not present

## 2015-05-20 LAB — URINE MICROSCOPIC-ADD ON
BACTERIA UA: NONE SEEN
RBC / HPF: NONE SEEN RBC/hpf (ref 0–5)

## 2015-05-20 LAB — CBC
HEMATOCRIT: 34.6 % — AB (ref 36.0–46.0)
HEMOGLOBIN: 11.6 g/dL — AB (ref 12.0–15.0)
MCH: 30.4 pg (ref 26.0–34.0)
MCHC: 33.5 g/dL (ref 30.0–36.0)
MCV: 90.6 fL (ref 78.0–100.0)
Platelets: 314 10*3/uL (ref 150–400)
RBC: 3.82 MIL/uL — AB (ref 3.87–5.11)
RDW: 13.3 % (ref 11.5–15.5)
WBC: 5.1 10*3/uL (ref 4.0–10.5)

## 2015-05-20 LAB — URINALYSIS, ROUTINE W REFLEX MICROSCOPIC
Bilirubin Urine: NEGATIVE
GLUCOSE, UA: NEGATIVE mg/dL
HGB URINE DIPSTICK: NEGATIVE
Ketones, ur: NEGATIVE mg/dL
Nitrite: NEGATIVE
PH: 6 (ref 5.0–8.0)
PROTEIN: NEGATIVE mg/dL
Specific Gravity, Urine: 1.01 (ref 1.005–1.030)

## 2015-05-20 MED ORDER — IBUPROFEN 800 MG PO TABS
800.0000 mg | ORAL_TABLET | Freq: Once | ORAL | Status: AC
  Administered 2015-05-20: 800 mg via ORAL
  Filled 2015-05-20: qty 1

## 2015-05-20 MED ORDER — IBUPROFEN 800 MG PO TABS: 800.0000 mg | ORAL_TABLET | Freq: Three times a day (TID) | ORAL | Status: DC | PRN

## 2015-05-20 MED ORDER — SULFAMETHOXAZOLE-TRIMETHOPRIM 800-160 MG PO TABS: 1.0000 | ORAL_TABLET | Freq: Two times a day (BID) | ORAL | Status: AC

## 2015-05-20 NOTE — Discharge Summary (Signed)
Admission History and Physical Exam for a Gynecology Patient  [name] is a 30 y.o. female, G2P1011, who presents for for evaluation of back pain, fatigue, and dysuria.. She has been followed at the [facility] and Gynecology division of [facility]. The patient had a vaginal delivery on 04/16/2015. She denies fever and GI complaints.  OB History    Gravida Para Term Preterm AB TAB SAB Ectopic Multiple Living   2 1 1  0 1 1 0 0 0 1      Obstetric Comments   Fetus with anomalies--induced per patient request at [facility]      Past Medical History  Diagnosis Date  . Mental disorder   . Schizoaffective disorder   . Complication of anesthesia   . PONV (postoperative nausea and vomiting)   . Schizoaffective disorder   . Heart murmur   . Obesity   . Vaginal Pap smear, abnormal   . Hx of varicella     Prescriptions prior to admission  Medication Sig Dispense Refill Last Dose  . ibuprofen (ADVIL,MOTRIN) 600 MG tablet Take 1 tablet (600 mg total) by mouth every 6 (six) hours as needed. 30 tablet 2 Past Month at Unknown time  . lamoTRIgine (LAMICTAL) 25 MG tablet Take 25 mg by mouth daily.   05/20/2015 at Unknown time  . oxyCODONE-acetaminophen (PERCOCET/ROXICET) 5-325 MG tablet Take 1 tablet by mouth every 4 (four) hours as needed (for pain scale 4-7). 30 tablet 0 05/19/2015 at Unknown time  . Prenatal Vit-Fe Fumarate-FA (PRENATAL MULTIVITAMIN) TABS tablet Take 1 tablet by mouth daily at 12 noon. 20 tablet 0 05/19/2015 at Unknown time  . ferrous sulfate 325 (65 FE) MG tablet Take 1 tablet (325 mg total) by mouth daily. (Patient not taking: Reported on 04/15/2015) 30 tablet 3 Not Taking at Unknown time    Past Surgical History  Procedure Laterality Date  . Breast surgery  2012    reduction  . Colposcopy      No Known Allergies  Family History: family history includes Arthritis in her father and mother; Bipolar disorder in her mother; Diabetes in her  father, maternal grandfather, maternal grandmother, and paternal grandmother; Hypertension in her maternal grandfather and maternal grandmother; Schizophrenia in her paternal grandmother. There is no history of Anesthesia problems.  Social History:  reports that she has never smoked. She has never used smokeless tobacco. She reports that she does not drink alcohol or use illicit drugs.  Review of systems: See HPI.  Admission Physical Exam:    There is no weight on file to calculate BMI.  Blood pressure 142/76, pulse 66, temperature 98.4 F (36.9 C), temperature source Oral, resp. rate 18, unknown if currently breastfeeding.  HEENT:                 Within normal limits Chest:                   Clear Heart:                    Regular rate and rhythm Abdomen:             Nontender, no masses Extremities:          Grossly normal Neurologic exam: Grossly normal Back:                    No CVA tenderness  Pelvic exam:  External genitalia: normal general appearance Vaginal: normal without tenderness,  induration or masses Cervix: Nontender Adnexa: normal bimanual exam Uterus: Normal size, firm, nontender   Results for orders placed or performed during the hospital encounter of 05/20/15 (from the past 24 hour(s))  Urinalysis, Routine w reflex microscopic (not at [facility])     Status: Abnormal   Collection Time: 05/20/15 12:10 PM  Result Value Ref Range   Color, Urine YELLOW YELLOW   APPearance CLEAR CLEAR   Specific Gravity, Urine 1.010 1.005 - 1.030   pH 6.0 5.0 - 8.0   Glucose, UA NEGATIVE NEGATIVE mg/dL   Hgb urine dipstick NEGATIVE NEGATIVE   Bilirubin Urine NEGATIVE NEGATIVE   Ketones, ur NEGATIVE NEGATIVE mg/dL   Protein, ur NEGATIVE NEGATIVE mg/dL   Nitrite NEGATIVE NEGATIVE   Leukocytes, UA TRACE (A) NEGATIVE  Urine microscopic-add on     Status: Abnormal   Collection Time: 05/20/15 12:10 PM  Result Value Ref Range   Squamous Epithelial / LPF 6-30 (A) NONE SEEN   WBC, UA  0-5 0 - 5 WBC/hpf   RBC / HPF NONE SEEN 0 - 5 RBC/hpf   Bacteria, UA NONE SEEN NONE SEEN  CBC     Status: Abnormal   Collection Time: 05/20/15 12:35 PM  Result Value Ref Range   WBC 5.1 4.0 - 10.5 K/uL   RBC 3.82 (L) 3.87 - 5.11 MIL/uL   Hemoglobin 11.6 (L) 12.0 - 15.0 g/dL   HCT 16.134.6 (L) 09.636.0 - 04.546.0 %   MCV 90.6 78.0 - 100.0 fL   MCH 30.4 26.0 - 34.0 pg   MCHC 33.5 30.0 - 36.0 g/dL   RDW 40.913.3 81.111.5 - 91.415.5 %   Platelets 314 150 - 400 K/uL     Assessment:  Status post vaginal delivery on 04/16/2015  Back pain  Fatigue  Dysuria   Questionable urinary tract infection  Plan:  The patient will be given ibuprofen here in the maternity admissions area.   A urine culture will be sent.  The patient will be given ibuprofen 800 mg every 8 hours for pain and Septra DS 1 tablet twice a day for 3 days.  She will follow-up in the office in 2 weeks or sooner if needed.   Dorothy LimboSTRINGER,Dorothy Pham 05/20/2015

## 2015-05-20 NOTE — MAU Note (Signed)
Patient had vaginal delivery on 04/16/15, having lower back pain and leg pain and left lower abdominal pain since delivery, denies dysuria, denies constipation. No vaginal bleeding stopped a few days.

## 2015-05-21 LAB — URINE CULTURE: Special Requests: NORMAL

## 2015-05-30 ENCOUNTER — Encounter (HOSPITAL_COMMUNITY): Payer: Self-pay | Admitting: Emergency Medicine

## 2015-05-30 ENCOUNTER — Emergency Department (HOSPITAL_COMMUNITY)
Admission: EM | Admit: 2015-05-30 | Discharge: 2015-05-30 | Discharge status: Against Medical Advice | Payer: Medicare Other | Attending: Emergency Medicine | Admitting: Emergency Medicine

## 2015-05-30 DIAGNOSIS — E669 Obesity, unspecified: Secondary | ICD-10-CM | POA: Diagnosis not present

## 2015-05-30 DIAGNOSIS — R109 Unspecified abdominal pain: Secondary | ICD-10-CM | POA: Insufficient documentation

## 2015-05-30 DIAGNOSIS — F329 Major depressive disorder, single episode, unspecified: Secondary | ICD-10-CM | POA: Diagnosis not present

## 2015-05-30 DIAGNOSIS — R011 Cardiac murmur, unspecified: Secondary | ICD-10-CM | POA: Diagnosis not present

## 2015-05-30 DIAGNOSIS — N939 Abnormal uterine and vaginal bleeding, unspecified: Secondary | ICD-10-CM | POA: Insufficient documentation

## 2015-05-30 NOTE — ED Notes (Addendum)
[facility] She is leaving without being seen. Pt's sister is with her currently and is staying with her. Pt  Denies SI or HI. Pt is alert and oriented x 4

## 2015-05-30 NOTE — ED Notes (Signed)
Pt states she is here for both mood swings with depression and abdominal pain. Pt had a baby 6 weeks ago. She started having vaginal bleeding about 3 weeks ago. She was taking abilify until she got pregnant. After her baby was born she was prescribed lamictal. Pt stopped taking that because she had paranoid thoughts. She is now taking no mediations for her mood swings. Pt is labile during assessment.

## 2015-06-01 ENCOUNTER — Encounter (HOSPITAL_COMMUNITY): Payer: Self-pay | Admitting: *Deleted

## 2015-06-01 ENCOUNTER — Emergency Department (HOSPITAL_COMMUNITY)
Admission: EM | Admit: 2015-06-01 | Discharge: 2015-06-01 | Disposition: A | Discharge status: Home / Self Care | Payer: Medicare Other | Attending: Emergency Medicine | Admitting: Emergency Medicine

## 2015-06-01 ENCOUNTER — Inpatient Hospital Stay (HOSPITAL_COMMUNITY): Payer: Medicare Other

## 2015-06-01 ENCOUNTER — Inpatient Hospital Stay (HOSPITAL_COMMUNITY)
Admission: AD | Admit: 2015-06-01 | Discharge: 2015-06-01 | Disposition: A | Discharge status: Home / Self Care | Payer: Medicare Other | Source: Ambulatory Visit | Attending: Obstetrics and Gynecology | Admitting: Obstetrics and Gynecology

## 2015-06-01 DIAGNOSIS — M79652 Pain in left thigh: Secondary | ICD-10-CM | POA: Insufficient documentation

## 2015-06-01 DIAGNOSIS — M5432 Sciatica, left side: Secondary | ICD-10-CM

## 2015-06-01 DIAGNOSIS — Z8659 Personal history of other mental and behavioral disorders: Secondary | ICD-10-CM | POA: Diagnosis not present

## 2015-06-01 DIAGNOSIS — F259 Schizoaffective disorder, unspecified: Secondary | ICD-10-CM | POA: Insufficient documentation

## 2015-06-01 DIAGNOSIS — Z8619 Personal history of other infectious and parasitic diseases: Secondary | ICD-10-CM | POA: Insufficient documentation

## 2015-06-01 DIAGNOSIS — R011 Cardiac murmur, unspecified: Secondary | ICD-10-CM | POA: Insufficient documentation

## 2015-06-01 DIAGNOSIS — E669 Obesity, unspecified: Secondary | ICD-10-CM | POA: Insufficient documentation

## 2015-06-01 DIAGNOSIS — M25552 Pain in left hip: Secondary | ICD-10-CM | POA: Diagnosis not present

## 2015-06-01 DIAGNOSIS — R109 Unspecified abdominal pain: Secondary | ICD-10-CM

## 2015-06-01 DIAGNOSIS — Z79899 Other long term (current) drug therapy: Secondary | ICD-10-CM | POA: Insufficient documentation

## 2015-06-01 DIAGNOSIS — R102 Pelvic and perineal pain: Secondary | ICD-10-CM | POA: Insufficient documentation

## 2015-06-01 LAB — URINALYSIS, ROUTINE W REFLEX MICROSCOPIC
Bilirubin Urine: NEGATIVE
Glucose, UA: NEGATIVE mg/dL
Ketones, ur: NEGATIVE mg/dL
Leukocytes, UA: NEGATIVE
Nitrite: NEGATIVE
Protein, ur: NEGATIVE mg/dL
SPECIFIC GRAVITY, URINE: 1.015 (ref 1.005–1.030)
pH: 6 (ref 5.0–8.0)

## 2015-06-01 LAB — URINE MICROSCOPIC-ADD ON: WBC, UA: NONE SEEN WBC/hpf (ref 0–5)

## 2015-06-01 NOTE — Discharge Instructions (Signed)

## 2015-06-01 NOTE — ED Notes (Signed)
Pt stated that no one could do anything about her pain so she was leaving.  She stated she didn't have time to waiting around for anything.  Pt did not want to talk to the doctor anymore.   Pt did comply to sign AMA and immediately left.

## 2015-06-01 NOTE — MAU Provider Note (Signed)
History    Dorothy Pham is a 30 y.o. Female who presents, after phone call, for left side pain.  Patient reports pain is constant on left side sharp shooting that feels "like an electrical shock."  Patient reports pain has been ongoing for 3 weeks and is getting progressively worse.  Patient states she was seen in ER tonight and left AMA, but had a pelvic exam and was told that she had no infection. Patient further reports some irriation in vaginal area, since having pelvic exam at last ED, that feels like "sticking pain." Patient informed that left side pain sounds like sciatica pain and patient displays familiarity with the term.  Patient asked if she was told this at the last hospital and patient states "no, but my cousin was told she had sciatica pain and they later found out it was cancer."  Patient now demanding "that I need to find out what is going on before I leave this hospital."     Patient Active Problem List   Diagnosis Date Noted  . Spontaneous vaginal delivery 04/16/2015  . Post term pregnancy, antepartum condition or complication 04/15/2015  . Murmur, cardiac 02/16/2015  . DOE (dyspnea on exertion) 02/16/2015  . Schizoaffective disorder 01/30/2015  . Dysplasia of cervix, low grade (CIN 1) 04/21/2012  . Amniotic band syndrome--2013 pregnancy 09/24/2011  . 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 Obesity 09/24/2011    Chief Complaint  Patient presents with  . Pelvic Pain  . Back Pain   HPI  OB History    Gravida Para Term Preterm AB TAB SAB Ectopic Multiple Living   0 1 1 0 0 0 1      Obstetric Comments   Fetus with anomalies--induced per patient request at [facility]      Past Medical History  Diagnosis Date  . Mental disorder   . Schizoaffective disorder   . Complication of anesthesia   . PONV (postoperative nausea and vomiting)   . Schizoaffective disorder   . Heart murmur   . Obesity   . Vaginal Pap smear, abnormal   . Hx  of varicella     Past Surgical History  Procedure Laterality Date  . Breast surgery  2012    reduction  . Colposcopy      Family History  Problem Relation Age of Onset  . Anesthesia problems Neg Hx   . Diabetes Father   . Arthritis Father   . Arthritis Mother   . Bipolar disorder Mother   . Diabetes Maternal Grandmother   . Hypertension Maternal Grandmother   . Hypertension Maternal Grandfather   . Diabetes Maternal Grandfather   . Diabetes Paternal Grandmother   . Schizophrenia Paternal Grandmother     Social History  Substance Use Topics  . Smoking status: Never Smoker   . Smokeless tobacco: Never Used  . Alcohol Use: No    Allergies: No Known Allergies  Prescriptions prior to admission  Medication Sig Dispense Refill Last Dose  . ferrous sulfate 325 (65 FE) MG tablet Take 1 tablet (325 mg total) by mouth daily. (Patient not taking: Reported on 04/15/2015) 30 tablet 3 Not Taking at Unknown time  . gabapentin (NEURONTIN) 400 MG capsule Take 400 mg by mouth at bedtime.   05/31/2015 at Unknown time  . ibuprofen (ADVIL,MOTRIN) 800 MG tablet Take 1 tablet (800 mg total) by mouth every 8 (eight) hours as needed. (Patient not taking: Reported on 05/30/2015) 50 tablet 1 Completed  Course at Unknown time  . oxyCODONE-acetaminophen (PERCOCET/ROXICET) 5-325 MG tablet Take 1 tablet by mouth every 4 (four) hours as needed (for pain scale 4-7). (Patient not taking: Reported on 05/20/2015) 30 tablet 0 Not Taking at Unknown time  . Prenatal Vit-Fe Fumarate-FA (PRENATAL MULTIVITAMIN) TABS tablet Take 1 tablet by mouth daily at 12 noon.   06/01/2015 at Unknown time    ROS  See HPI Above Physical Exam   Blood pressure 134/74, pulse 54, temperature 98.2 F (36.8 C), resp. rate 18, height 5' 7.5" (1.715 m), weight 100.789 kg (222 lb 3.2 oz), last menstrual period 05/28/2015, not currently breastfeeding.  Results for orders placed or performed during the hospital encounter of 06/01/15 (from the  past 24 hour(s))  Urinalysis, Routine w reflex microscopic (not at [facility])     Status: Abnormal   Collection Time: 06/01/15  8:30 PM  Result Value Ref Range   Color, Urine YELLOW YELLOW   APPearance CLEAR CLEAR   Specific Gravity, Urine 1.015 1.005 - 1.030   pH 6.0 5.0 - 8.0   Glucose, UA NEGATIVE NEGATIVE mg/dL   Hgb urine dipstick TRACE (A) NEGATIVE   Bilirubin Urine NEGATIVE NEGATIVE   Ketones, ur NEGATIVE NEGATIVE mg/dL   Protein, ur NEGATIVE NEGATIVE mg/dL   Nitrite NEGATIVE NEGATIVE   Leukocytes, UA NEGATIVE NEGATIVE  Urine microscopic-add on     Status: Abnormal   Collection Time: 06/01/15  8:30 PM  Result Value Ref Range   Squamous Epithelial / LPF 0-5 (A) NONE SEEN   WBC, UA NONE SEEN 0 - 5 WBC/hpf   RBC / HPF 0-5 0 - 5 RBC/hpf   Bacteria, UA FEW (A) NONE SEEN    Physical Exam  HENT:  Head: Normocephalic and atraumatic.  Neck: Normal range of motion.  Cardiovascular: Normal rate.   Respiratory: Effort normal.  GI: Soft.  Genitourinary: Rectum normal. There is tenderness on the right labia. There is no rash, lesion or injury on the right labia.  Vulva & Perineum: Intact, No s/s of infection, inflammation, or irritation Laceration well healed-No s/s of trauma  Patient sensitive to touch and can not tolerate light touch of hand or insertion of speculum.  Speculum and Bimanual exam deferred.  Anus:  Appears intact, no hemorrhoids   Psychiatric: Her affect is labile. Her speech is tangential. She is agitated.    ED Course  Assessment: 29y.o. Female Left Sided Pain-Sciatica Vaginal Tenderness Schizoaffective D/O-Unmedicated SVD with Repair of Laceration 12/5  Plan: -PE as above -Attempted to reassure patient regarding concern of cancer diagnosis -Discussed limited vaginal exam and perceived "sticking" most likely from vaginal hair and/or wiping of perineum area -Discussed sciatica pain, presentation, and treatment in depth -Patient feels need for US-informed  that [place] would be of abdominal/pelvic area and pain is located in buttock area -Complete Pelvic US ordered  Follow Up (2234) -Nurse reports patient left AMA siting provider "attitude" as reason and that she will return at a later date for care.  Cherre Robins CNM, MSN 06/01/2015 9:30 PM

## 2015-06-01 NOTE — MAU Note (Signed)
Pt stated she di dnot want to go to U/S. Stated she did not like the attitude of the provider who saw her and would come back when she was not here. Offered to take her to u/s so we could further evaluate her but still did not want to go. Pt signed out AMA . Notified J.Emly,CNM.

## 2015-06-01 NOTE — MAU Note (Addendum)
Pain L side and shoots down L leg. Shooting pain when lifts L leg. Pelvic pressure. SVD 12/5. Pain started about 3wks after delivery and is getting worse. Told doctor about it at 6wk recheck. Given ibuprofen. Pain continues and not getting better. Told I had uti and took the medicine they gave me

## 2015-06-01 NOTE — ED Notes (Addendum)
Pt reports pelvic pain since having her baby 6 weeks ago.  Pt describes pain as sharp stabbing pain.  Pt reports she has a pending appt with her OB Wednesday but wants to find out now what's causing the pain.  Pt reports pain is worse when ambulating and when sitting down.   Pt reports she was seen at [facility] and was told that she had part of a suture hanging out. However, there isn't anywhere in her chart showing pt went to [facility].

## 2015-06-01 NOTE — ED Provider Notes (Signed)
CSN: [id number]     Arrival date & time 06/01/15  1602 History   First MD Initiated Contact with Patient 06/01/15 1801     Chief Complaint  Patient presents with  . Pelvic Pain     HPI   30 year old female presents today with pelvic pain. Patient reports that 6 weeks ago she delivered a baby via normal vaginal delivery. She reports one stitch was placed at that time, 2 weeks later followed up because it was "poking out". She reports that at that time she informed her OB/GYN that she was having pain to her left hip and lateral pelvis. She was scheduled for a follow-up evaluation on the 25th. She reports that she continues to have pain, worse with walking or placing weight on the leg. Patient per she is awake ambulate, has no loss of distal sensation strength or motor function. Patient denies any true vaginal pain, or swelling to the vagina or perineum. She states she's been using ibuprofen at home but only helps for short amount of time, then returns. Patient denies any vaginal discharge, notes vaginal bleeding which has been present for one week, OB/GYN aware of this.   Past Medical History  Diagnosis Date  . Mental disorder   . Schizoaffective disorder   . Complication of anesthesia   . PONV (postoperative nausea and vomiting)   . Schizoaffective disorder   . Heart murmur   . Obesity   . Vaginal Pap smear, abnormal   . Hx of varicella    Past Surgical History  Procedure Laterality Date  . Breast surgery  2012    reduction  . Colposcopy     Family History  Problem Relation Age of Onset  . Anesthesia problems Neg Hx   . Diabetes Father   . Arthritis Father   . Arthritis Mother   . Bipolar disorder Mother   . Diabetes Maternal Grandmother   . Hypertension Maternal Grandmother   . Hypertension Maternal Grandfather   . Diabetes Maternal Grandfather   . Diabetes Paternal Grandmother   . Schizophrenia Paternal Grandmother    Social History  Substance Use Topics  . Smoking  status: Never Smoker   . Smokeless tobacco: Never Used  . Alcohol Use: No   OB History    Gravida Para Term Preterm AB TAB SAB Ectopic Multiple Living   0 1 1 0 0 0 1      Obstetric Comments   Fetus with anomalies--induced per patient request at [facility]     Review of Systems  All other systems reviewed and are negative.   Allergies  Review of patient's allergies indicates no known allergies.  Home Medications   Prior to Admission medications   Medication Sig Start Date End Date Taking? Authorizing Provider  gabapentin (NEURONTIN) 400 MG capsule Take 400 mg by mouth at bedtime.   Yes Historical Provider, MD  Prenatal Vit-Fe Fumarate-FA (PRENATAL MULTIVITAMIN) TABS tablet Take 1 tablet by mouth daily at 12 noon.   Yes Historical Provider, MD  ferrous sulfate 325 (65 FE) MG tablet Take 1 tablet (325 mg total) by mouth daily. Patient not taking: Reported on 04/15/2015 01/30/15 01/30/16  Nigel Bridgeman, CNM  ibuprofen (ADVIL,MOTRIN) 800 MG tablet Take 1 tablet (800 mg total) by mouth every 8 (eight) hours as needed. Patient not taking: Reported on 05/30/2015 05/20/15   Kirkland Hun, MD  oxyCODONE-acetaminophen (PERCOCET/ROXICET) 5-325 MG tablet Take 1 tablet by mouth every 4 (four) hours as needed (for pain scale  4-7). Patient not taking: Reported on 05/20/2015 04/18/15   Nigel Bridgeman, CNM   BP 137/75 mmHg  Pulse 80  Temp(Src) 98.6 F (37 C) (Oral)  Resp 20  SpO2 99%  LMP 05/28/2015   Physical Exam  Constitutional: She is oriented to person, place, and time. She appears well-developed and well-nourished.  HENT:  Head: Normocephalic and atraumatic.  Eyes: Conjunctivae are normal. Pupils are equal, round, and reactive to light. Right eye exhibits no discharge. Left eye exhibits no discharge. No scleral icterus.  Neck: Normal range of motion. No JVD present. No tracheal deviation present.  Pulmonary/Chest: Effort normal. No stridor.  Genitourinary:  No vaginal bleeding, no  tenderness, mass, fluctuance of the vaginal wall or surrounding soft tissue. No vaginal discharge.  Musculoskeletal:  No lower extremity swelling or edema  Tenderness to palpation of the proximal posterior thigh, ischium , and pelvic crest no swelling, redness, fluctuance, signs of infection. Worse with flexion and extension of the hip.  Neurological: She is alert and oriented to person, place, and time. Coordination normal.  Psychiatric: She has a normal mood and affect. Her behavior is normal. Judgment and thought content normal.  Nursing note and vitals reviewed.   ED Course  Procedures (including critical care time) Labs Review Labs Reviewed - No data to display  Imaging Review No results found. I have personally reviewed and evaluated these images and lab results as part of my medical decision-making.   EKG Interpretation None      MDM   Final diagnoses:  Pain of left thigh    Labs:  Imaging:  Consults:  Therapeutics:  Discharge Meds:   Assessment/Plan: Patient presents with likely musculoskeletal pain. Vaginal exam shows no signs of infectious etiology, no tenderness to the vaginal wall. No tenderness to the abdomen or pelvis, point tenderness to the issue him and posterior musculature of the thigh. She has no signs of infectious etiology that would indicate abscess, no trauma or mechanism of injury that would indicate significant muscle tear for bony involvement. Patient is able to ambulate. Patient has no lower extremity swelling or edema, or any other signs or symptoms that would indicate DVT. Patient has follow-up appointment with her OB/GYN, she is encouraged to contact them tomorrow and schedule first available follow-up. Patient reports that ibuprofen improves the pain, but this is only temporary, she is not breast-feeding and is encouraged to use ibuprofen or Tylenol as needed for pain. Patient given strict return precautions.        Eyvonne Mechanic,  PA-C 06/01/15 1956  Mancel Bale, MD 06/01/15 2337

## 2015-06-12 ENCOUNTER — Other Ambulatory Visit: Payer: Self-pay | Admitting: Obstetrics and Gynecology

## 2015-07-01 ENCOUNTER — Emergency Department (HOSPITAL_COMMUNITY)
Admission: EM | Admit: 2015-07-01 | Discharge: 2015-07-02 | Disposition: A | Discharge status: Other Acute Inpatient Hospital | Payer: Medicare Other | Attending: Emergency Medicine | Admitting: Emergency Medicine

## 2015-07-01 ENCOUNTER — Encounter (HOSPITAL_COMMUNITY): Payer: Self-pay | Admitting: Nurse Practitioner

## 2015-07-01 DIAGNOSIS — F258 Other schizoaffective disorders: Secondary | ICD-10-CM | POA: Diagnosis not present

## 2015-07-01 DIAGNOSIS — Z79899 Other long term (current) drug therapy: Secondary | ICD-10-CM | POA: Diagnosis not present

## 2015-07-01 DIAGNOSIS — E669 Obesity, unspecified: Secondary | ICD-10-CM | POA: Diagnosis not present

## 2015-07-01 DIAGNOSIS — F22 Delusional disorders: Secondary | ICD-10-CM | POA: Diagnosis not present

## 2015-07-01 DIAGNOSIS — F259 Schizoaffective disorder, unspecified: Secondary | ICD-10-CM | POA: Diagnosis not present

## 2015-07-01 DIAGNOSIS — Z8619 Personal history of other infectious and parasitic diseases: Secondary | ICD-10-CM | POA: Diagnosis not present

## 2015-07-01 DIAGNOSIS — R011 Cardiac murmur, unspecified: Secondary | ICD-10-CM | POA: Insufficient documentation

## 2015-07-01 DIAGNOSIS — F419 Anxiety disorder, unspecified: Secondary | ICD-10-CM | POA: Diagnosis not present

## 2015-07-01 DIAGNOSIS — R451 Restlessness and agitation: Secondary | ICD-10-CM | POA: Diagnosis not present

## 2015-07-01 DIAGNOSIS — F911 Conduct disorder, childhood-onset type: Secondary | ICD-10-CM | POA: Diagnosis not present

## 2015-07-01 DIAGNOSIS — Z046 Encounter for general psychiatric examination, requested by authority: Secondary | ICD-10-CM | POA: Diagnosis present

## 2015-07-01 LAB — CBC
HEMATOCRIT: 35.6 % — AB (ref 36.0–46.0)
Hemoglobin: 12.1 g/dL (ref 12.0–15.0)
MCH: 30.6 pg (ref 26.0–34.0)
MCHC: 34 g/dL (ref 30.0–36.0)
MCV: 89.9 fL (ref 78.0–100.0)
PLATELETS: 337 10*3/uL (ref 150–400)
RBC: 3.96 MIL/uL (ref 3.87–5.11)
RDW: 13.6 % (ref 11.5–15.5)
WBC: 10 10*3/uL (ref 4.0–10.5)

## 2015-07-01 LAB — COMPREHENSIVE METABOLIC PANEL
ALT: 23 U/L (ref 14–54)
AST: 27 U/L (ref 15–41)
Albumin: 4.9 g/dL (ref 3.5–5.0)
Alkaline Phosphatase: 65 U/L (ref 38–126)
Anion gap: 12 (ref 5–15)
BILIRUBIN TOTAL: 0.7 mg/dL (ref 0.3–1.2)
BUN: 11 mg/dL (ref 6–20)
CHLORIDE: 108 mmol/L (ref 101–111)
CO2: 23 mmol/L (ref 22–32)
CREATININE: 0.72 mg/dL (ref 0.44–1.00)
Calcium: 10.2 mg/dL (ref 8.9–10.3)
GFR calc Af Amer: >60 mL/min (ref >60)
GLUCOSE: 105 mg/dL — AB (ref 65–99)
Potassium: 3.5 mmol/L (ref 3.5–5.1)
Sodium: 143 mmol/L (ref 135–145)
TOTAL PROTEIN: 8.5 g/dL — AB (ref 6.5–8.1)

## 2015-07-01 LAB — ACETAMINOPHEN LEVEL

## 2015-07-01 LAB — ETHANOL

## 2015-07-01 LAB — SALICYLATE LEVEL

## 2015-07-01 MED ORDER — ZOLPIDEM TARTRATE 5 MG PO TABS: 5.0000 mg | ORAL_TABLET | Freq: Every evening | ORAL | Status: DC | PRN

## 2015-07-01 MED ORDER — LORAZEPAM 1 MG PO TABS
1.0000 mg | ORAL_TABLET | Freq: Three times a day (TID) | ORAL | Status: DC | PRN
  Administered 2015-07-01: 1 mg via ORAL
  Filled 2015-07-01: qty 1

## 2015-07-01 MED ORDER — ONDANSETRON HCL 4 MG PO TABS: 4.0000 mg | ORAL_TABLET | Freq: Three times a day (TID) | ORAL | Status: DC | PRN

## 2015-07-01 NOTE — ED Notes (Signed)
Provided patient ice water.  

## 2015-07-01 NOTE — ED Notes (Signed)
Pt. To [facility] from ED ambulatory without difficulty, to room 37 . Report from Joanna RN. Pt. Is alert and oriented, warm and dry in no distress. Pt. Denies SI, HI, and AVH. Pt. Calm and cooperative. Pt. Made aware of security cameras and Q15 minute rounds. Pt. Encouraged to let Nursing staff know of any concerns or needs.  

## 2015-07-01 NOTE — ED Notes (Signed)
Pt. Noted sleeping in room. No complaints or concerns voiced. No distress or abnormal behavior noted. Will continue to monitor with security cameras. Q 15 minute rounds continue. 

## 2015-07-01 NOTE — BH Assessment (Signed)
Assessment completed. Consulted Ijeoma Nwaeze, NP who recommended inpatient treatment. TTS to seek placement.  

## 2015-07-01 NOTE — ED Notes (Signed)
Pt. C/o anxiety. 

## 2015-07-01 NOTE — BH Assessment (Addendum)
Tele Assessment Note   Dorothy Pham is an 30 y.o. female presenting to [facility] after being petitioned by her father for involuntary commitment. Pt stated "I am weary and my head is cloudy". "I just had a baby and I feel like I am losing my mind". "I am nervous and I feel overwhelm". "I was treated for post-partum". "I was stable on Abilify but I had to stop taking it because of the pregnancy". Pt reported that she took her Abilify two days ago. Pt denies SI and HI at this time. Pt reported that she has visual hallucinations and reported that she sees shadows. Pt stated "my head gets cloudy and I get dizzy".  Pt is reporting multiple depressive symptoms and shared that her sleep and appetite have been poor. Pt did not report any alcohol or illicit substance use at this time.  Collateral information was gathered from pt's father who reported that pt has been paranoid and "getting into it" with her mother and sister. He reported that pt has been crying and experiencing confusion. He reported that pt got into it with her sister today and broke the door down trying to get out of it. He shared that pt has been diagnosed with schizophrenia in the past and has been stable on her medication until her pregnancy. He shared that pt cared for her infant until her mental health decline and now her mother helps her care for the baby. Pt's mother also provided additional information and reported that pt  is aware that she needs help. She shared that this has not happen in 15 years. She reported that pt is a good mother and has enjoyed caring for her baby.   Diagnosis: Schizophrenia   Past Medical History:  Past Medical History  Diagnosis Date  . Mental disorder   . Schizoaffective disorder   . Complication of anesthesia   . PONV (postoperative nausea and vomiting)   . Schizoaffective disorder   . Heart murmur   . Obesity   . Vaginal Pap smear, abnormal   . Hx of varicella     Past Surgical History  Procedure  Laterality Date  . Breast surgery  2012    reduction  . Colposcopy      Family History:  Family History  Problem Relation Age of Onset  . Anesthesia problems Neg Hx   . Diabetes Father   . Arthritis Father   . Arthritis Mother   . Bipolar disorder Mother   . Diabetes Maternal Grandmother   . Hypertension Maternal Grandmother   . Hypertension Maternal Grandfather   . Diabetes Maternal Grandfather   . Diabetes Paternal Grandmother   . Schizophrenia Paternal Grandmother     Social History:  reports that she has never smoked. She has never used smokeless tobacco. She reports that she does not drink alcohol or use illicit drugs.  Additional Social History:  Alcohol / Drug Use History of alcohol / drug use?: No history of alcohol / drug abuse  CIWA: CIWA-Ar BP: 150/95 mmHg Pulse Rate: 80 COWS:    PATIENT STRENGTHS: (choose at least two) Average or above average intelligence Motivation for treatment/growth  Allergies: No Known Allergies  Home Medications:  (Not in a hospital admission)  OB/GYN Status:  Patient's last menstrual period was 05/28/2015.  General Assessment Data Location of Assessment: [facility] ED TTS Assessment: In system Is this a Tele or Face-to-Face Assessment?: Face-to-Face Is this an Initial Assessment or a Re-assessment for this encounter?: Initial Assessment Marital status:  Single Is patient pregnant?: No Pregnancy Status: No Living Arrangements: Other relatives Can pt return to current living arrangement?: Yes Admission Status: Involuntary Is patient capable of signing voluntary admission?: Yes Referral Source: Self/Family/Friend Insurance type: Medicare     Crisis Care Plan Living Arrangements: Other relatives Name of Psychiatrist: Dr. Lajean Silvius Name of Therapist: No provider reported.   Education Status Is patient currently in school?: No Current Grade: N/A Highest grade of school patient has completed: N/A Name of school: N/A Contact person:  N/A  Risk to self with the past 6 months Suicidal Ideation: No Has patient been a risk to self within the past 6 months prior to admission? : No Suicidal Intent: No Has patient had any suicidal intent within the past 6 months prior to admission? : No Is patient at risk for suicide?: No Suicidal Plan?: No Has patient had any suicidal plan within the past 6 months prior to admission? : No Access to Means: No What has been your use of drugs/alcohol within the last 12 months?: Pt denies. Previous Attempts/Gestures: No How many times?: 0 Other Self Harm Risks: Pt denies  Triggers for Past Attempts: None known Intentional Self Injurious Behavior: None Family Suicide History: No Recent stressful life event(s): Other (Comment) (first child) Persecutory voices/beliefs?: No Depression: Yes Depression Symptoms: Despondent, Isolating, Fatigue, Loss of interest in usual pleasures, Feeling worthless/self pity, Feeling angry/irritable Substance abuse history and/or treatment for substance abuse?: No Suicide prevention information given to non-admitted patients: Not applicable  Risk to Others within the past 6 months Homicidal Ideation: No Does patient have any lifetime risk of violence toward others beyond the six months prior to admission? : No Thoughts of Harm to Others: No Current Homicidal Intent: No Current Homicidal Plan: No Access to Homicidal Means: No Identified Victim: N/A History of harm to others?: No Assessment of Violence: None Noted Violent Behavior Description: No violent behaviors observed. Pt is calm and cooperative at this time.  Does patient have access to weapons?: No Criminal Charges Pending?: No Does patient have a court date: No Is patient on probation?: No  Psychosis Hallucinations: Visual ("I see shadows" ) Delusions: None noted  Mental Status Report Appearance/Hygiene: In scrubs Eye Contact: Fair Motor Activity: Freedom of movement Speech: Soft,  Slow Level of Consciousness: Drowsy Mood: Depressed Affect: Appropriate to circumstance Anxiety Level: Minimal Thought Processes: Coherent, Relevant Judgement: Unimpaired Orientation: Appropriate for developmental age Obsessive Compulsive Thoughts/Behaviors: None  Cognitive Functioning Concentration: Decreased Memory: Recent Intact, Remote Intact IQ: Average Insight: Good Impulse Control: Good Appetite: Poor Weight Loss: 0 Weight Gain: 0 Sleep: Decreased Vegetative Symptoms: Staying in bed  ADLScreening [facility] Assessment Services) Patient's cognitive ability adequate to safely complete daily activities?: Yes Patient able to express need for assistance with ADLs?: Yes Independently performs ADLs?: Yes (appropriate for developmental age)  Prior Inpatient Therapy Prior Inpatient Therapy: No Prior Therapy Dates: N/A Prior Therapy Facilty/Provider(s): N/A Reason for Treatment: N/A  Prior Outpatient Therapy Prior Outpatient Therapy: Yes Prior Therapy Dates: Current  Prior Therapy Facilty/Provider(s): Dr. Lajean Silvius Reason for Treatment: Medication managment  Does patient have an ACCT team?: No Does patient have Intensive In-House Services?  : No Does patient have [facility] services? : No Does patient have P4CC services?: No  ADL Screening (condition at time of admission) Patient's cognitive ability adequate to safely complete daily activities?: Yes Is the patient deaf or have difficulty hearing?: No Does the patient have difficulty seeing, even when wearing glasses/contacts?: No Does the patient have difficulty concentrating, remembering,  or making decisions?: Yes Patient able to express need for assistance with ADLs?: Yes Does the patient have difficulty dressing or bathing?: No Independently performs ADLs?: Yes (appropriate for developmental age)       Abuse/Neglect Assessment (Assessment to be complete while patient is alone) Physical Abuse: Denies Verbal Abuse:  Denies Sexual Abuse: Denies Exploitation of patient/patient's resources: Denies Self-Neglect: Denies     Merchant [employer] officer (For Healthcare) Does patient have an advance directive?: No Would patient like information on creating an advanced directive?: No - patient declined information    Additional Information 1:1 In Past 12 Months?: No CIRT Risk: No Elopement Risk: No Does patient have medical clearance?: Yes     Disposition: Inpatient treatment  Disposition Initial Assessment Completed for this Encounter: Yes  Constancia Geeting S 07/01/2015 9:22 PM

## 2015-07-01 NOTE — ED Provider Notes (Signed)
CSN: [id number]     Arrival date & time 07/01/15  1851 History   By signing my name below, I, Dorothy Pham, attest that this documentation has been prepared under the direction and in the presence of [employer], PA-C.  Electronically Signed: Arlan Pham, ED Scribe. 07/01/2015. 8:45 PM.   Chief Complaint  Patient presents with  . IVC   . Aggressive Behavior   The history is provided by the patient and a relative. No language interpreter was used.    HPI Comments: Dorothy Pham, here under IVC paperwork is a 30 y.o. female with a PMHx of mental disorder and schizoaffective disorder who presents to the Emergency Department here for aggressive behavior this evening. Pt was sent from [facility] as they reported pt was fighting other patients per triage note. Father states "she just wasn't happy at [facility]". Initially she was sent to [facility] for visual/auditory hallucinations and being "emotionally unstable". Family states pt just started back on her Abilify medication yesterday which she has been off of for almost 1 year. Pt had her first meal today in several days. She is not currently breast feeding.  PCP: Quitman Livings, MD    Past Medical History  Diagnosis Date  . Mental disorder   . Schizoaffective disorder   . Complication of anesthesia   . PONV (postoperative nausea and vomiting)   . Schizoaffective disorder   . Heart murmur   . Obesity   . Vaginal Pap smear, abnormal   . Hx of varicella    Past Surgical History  Procedure Laterality Date  . Breast surgery  2012    reduction  . Colposcopy     Family History  Problem Relation Age of Onset  . Anesthesia problems Neg Hx   . Diabetes Father   . Arthritis Father   . Arthritis Mother   . Bipolar disorder Mother   . Diabetes Maternal Grandmother   . Hypertension Maternal Grandmother   . Hypertension Maternal Grandfather   . Diabetes Maternal Grandfather   . Diabetes Paternal Grandmother   . Schizophrenia Paternal Grandmother     Social History  Substance Use Topics  . Smoking status: Never Smoker   . Smokeless tobacco: Never Used  . Alcohol Use: No   OB History    Gravida Para Term Preterm AB TAB SAB Ectopic Multiple Living   0 1 1 0 0 0 1      Obstetric Comments   Fetus with anomalies--induced per patient request at [facility]     Review of Systems  Psychiatric/Behavioral: Positive for hallucinations and behavioral problems.  All other systems reviewed and are negative.     Allergies  Review of patient's allergies indicates no known allergies.  Home Medications   Prior to Admission medications   Medication Sig Start Date End Date Taking? Authorizing Provider  ARIPiprazole (ABILIFY) 20 MG tablet Take 20 mg by mouth at bedtime. Reported on 07/01/2015   Yes Historical Provider, MD  diphenhydrAMINE (BENADRYL) 12.5 MG/5ML elixir Take 25 mg by mouth at bedtime as needed for sleep.   Yes Historical Provider, MD  ferrous sulfate 325 (65 FE) MG tablet Take 1 tablet (325 mg total) by mouth daily. Patient not taking: Reported on 07/01/2015 01/30/15 01/30/16  Nigel Bridgeman, CNM  ibuprofen (ADVIL,MOTRIN) 800 MG tablet Take 1 tablet (800 mg total) by mouth every 8 (eight) hours as needed. Patient not taking: Reported on 07/01/2015 05/20/15   Kirkland Hun, MD   Triage Vitals: BP 150/95 mmHg  Pulse 80  Temp(Src) 98.1 F (36.7 C) (Oral)  Resp 18  SpO2 100%  LMP 05/28/2015  Breastfeeding? Unknown   Physical Exam  Constitutional: She is oriented to person, place, and time. She appears well-developed and well-nourished. No distress.  HENT:  Head: Normocephalic and atraumatic.  Eyes: Conjunctivae and EOM are normal. No scleral icterus.  Neck: Normal range of motion.  Pulmonary/Chest: Effort normal. No respiratory distress.  Musculoskeletal: Normal range of motion.  Neurological: She is alert and oriented to person, place, and time. She exhibits normal muscle tone. Coordination normal.  Skin: Skin is warm  and dry. No rash noted. She is not diaphoretic. No erythema. No pallor.  Psychiatric: Her speech is normal. Her mood appears anxious. She is withdrawn and actively hallucinating. Thought content is paranoid. Cognition and memory are normal. She expresses no homicidal and no suicidal ideation.  Nursing note and vitals reviewed.   ED Course  Procedures (including critical care time)  DIAGNOSTIC STUDIES: Oxygen Saturation is 100% on RA, Normal by my interpretation.    COORDINATION OF CARE: 8:45 PM- Will order CMP, Ethanol, Salicylate level, acetaminophen level, CBC, and urine rapid druge screen. Will give Ativan, Ambien, and Zofran. Discussed treatment plan with pt at bedside and pt agreed to plan.     Labs Review Labs Reviewed  COMPREHENSIVE METABOLIC PANEL - Abnormal; Notable for the following:    Glucose, Bld 105 (*)    Total Protein 8.5 (*)    All other components within normal limits  ACETAMINOPHEN LEVEL - Abnormal; Notable for the following:    Acetaminophen (Tylenol), Serum <10 (*)    All other components within normal limits  CBC - Abnormal; Notable for the following:    HCT 35.6 (*)    All other components within normal limits  ETHANOL  SALICYLATE LEVEL  URINE RAPID DRUG SCREEN, HOSP PERFORMED    Imaging Review No results found.   I have personally reviewed and evaluated these images and lab results as part of my medical decision-making.   EKG Interpretation None      MDM   Final diagnoses:  Schizoaffective disorder, unspecified type (HCC)  Paranoid (HCC)  Agitation    Patient medically cleared. TTS attempting placement for schizophrenia.  I personally performed the services described in this documentation, which was scribed in my presence. The recorded information has been reviewed and is accurate.    Filed Vitals:   07/01/15 1851  BP: 150/95  Pulse: 80  Temp: 98.1 F (36.7 C)  TempSrc: Oral  Resp: 18  SpO2: 100%     Antony Madura, PA-C 07/02/15  1610  Raeford Razor, MD 07/05/15 [phone]

## 2015-07-01 NOTE — ED Notes (Signed)
Pt has been sent from [facility] for further evaluation reportedly aggressive and fighting other patients. With [employer] at bedside pt is calm and cooperative.

## 2015-07-01 NOTE — ED Notes (Signed)
ALL PT BELONGINGS GIVEN TO PT'S FATHER Dorothy Pham

## 2015-07-02 ENCOUNTER — Encounter (HOSPITAL_COMMUNITY): Payer: Self-pay | Admitting: *Deleted

## 2015-07-02 ENCOUNTER — Inpatient Hospital Stay (HOSPITAL_COMMUNITY)
Admission: AD | Admit: 2015-07-02 | Discharge: 2015-07-10 | DRG: 885 | Disposition: A | Discharge status: Home / Self Care | Payer: Medicare Other | Attending: Psychiatry | Admitting: Psychiatry

## 2015-07-02 DIAGNOSIS — Z8249 Family history of ischemic heart disease and other diseases of the circulatory system: Secondary | ICD-10-CM

## 2015-07-02 DIAGNOSIS — Z818 Family history of other mental and behavioral disorders: Secondary | ICD-10-CM | POA: Diagnosis not present

## 2015-07-02 DIAGNOSIS — F258 Other schizoaffective disorders: Secondary | ICD-10-CM | POA: Diagnosis not present

## 2015-07-02 DIAGNOSIS — Z833 Family history of diabetes mellitus: Secondary | ICD-10-CM | POA: Diagnosis not present

## 2015-07-02 DIAGNOSIS — Z8261 Family history of arthritis: Secondary | ICD-10-CM | POA: Diagnosis not present

## 2015-07-02 DIAGNOSIS — F411 Generalized anxiety disorder: Secondary | ICD-10-CM | POA: Diagnosis present

## 2015-07-02 DIAGNOSIS — F259 Schizoaffective disorder, unspecified: Secondary | ICD-10-CM | POA: Diagnosis present

## 2015-07-02 DIAGNOSIS — F22 Delusional disorders: Secondary | ICD-10-CM | POA: Insufficient documentation

## 2015-07-02 DIAGNOSIS — G4700 Insomnia, unspecified: Secondary | ICD-10-CM | POA: Diagnosis present

## 2015-07-02 DIAGNOSIS — F250 Schizoaffective disorder, bipolar type: Secondary | ICD-10-CM | POA: Diagnosis present

## 2015-07-02 MED ORDER — LORAZEPAM 2 MG/ML IJ SOLN: 1.0000 mg | Freq: Once | INTRAMUSCULAR | Status: DC

## 2015-07-02 MED ORDER — LORAZEPAM 1 MG PO TABS: 2.0000 mg | ORAL_TABLET | Freq: Once | ORAL | Status: DC

## 2015-07-02 MED ORDER — BENZTROPINE MESYLATE 1 MG PO TABS
1.0000 mg | ORAL_TABLET | Freq: Every day | ORAL | Status: DC
  Administered 2015-07-06 – 2015-07-07 (×2): 1 mg via ORAL
  Filled 2015-07-02 (×11): qty 1

## 2015-07-02 MED ORDER — ARIPIPRAZOLE 10 MG PO TABS
20.0000 mg | ORAL_TABLET | Freq: Every day | ORAL | Status: DC
  Administered 2015-07-02 – 2015-07-03 (×2): 20 mg via ORAL
  Filled 2015-07-02 (×4): qty 2

## 2015-07-02 MED ORDER — HYDROXYZINE HCL 25 MG PO TABS
25.0000 mg | ORAL_TABLET | Freq: Three times a day (TID) | ORAL | Status: DC | PRN
  Administered 2015-07-07 – 2015-07-08 (×2): 25 mg via ORAL
  Filled 2015-07-02 (×2): qty 1

## 2015-07-02 MED ORDER — ARIPIPRAZOLE 10 MG PO TABS
20.0000 mg | ORAL_TABLET | Freq: Every day | ORAL | Status: DC
  Filled 2015-07-02: qty 2

## 2015-07-02 MED ORDER — ALUM & MAG HYDROXIDE-SIMETH 200-200-20 MG/5ML PO SUSP: 30.0000 mL | ORAL | Status: DC | PRN

## 2015-07-02 MED ORDER — HYDROXYZINE HCL 25 MG PO TABS
25.0000 mg | ORAL_TABLET | Freq: Three times a day (TID) | ORAL | Status: DC | PRN
Start: 2015-07-02 — End: 2015-07-02

## 2015-07-02 MED ORDER — TRAZODONE HCL 100 MG PO TABS: 100.0000 mg | ORAL_TABLET | Freq: Every day | ORAL | Status: DC

## 2015-07-02 MED ORDER — DIPHENHYDRAMINE HCL 50 MG/ML IJ SOLN: 50.0000 mg | Freq: Once | INTRAMUSCULAR | Status: DC

## 2015-07-02 MED ORDER — DIPHENHYDRAMINE HCL 50 MG PO CAPS: 50.0000 mg | ORAL_CAPSULE | Freq: Once | ORAL | Status: DC

## 2015-07-02 MED ORDER — LISINOPRIL 20 MG PO TABS
20.0000 mg | ORAL_TABLET | Freq: Once | ORAL | Status: DC
  Filled 2015-07-02: qty 1

## 2015-07-02 MED ORDER — ZIPRASIDONE MESYLATE 20 MG IM SOLR: 10.0000 mg | Freq: Once | INTRAMUSCULAR | Status: DC

## 2015-07-02 MED ORDER — ZIPRASIDONE MESYLATE 20 MG IM SOLR
10.0000 mg | Freq: Once | INTRAMUSCULAR | Status: DC
  Filled 2015-07-02: qty 20

## 2015-07-02 MED ORDER — OLANZAPINE 5 MG PO TBDP: 5.0000 mg | ORAL_TABLET | Freq: Three times a day (TID) | ORAL | Status: DC | PRN

## 2015-07-02 MED ORDER — LORAZEPAM 1 MG PO TABS: 1.0000 mg | ORAL_TABLET | ORAL | Status: DC | PRN

## 2015-07-02 MED ORDER — BENZTROPINE MESYLATE 1 MG PO TABS: 1.0000 mg | ORAL_TABLET | Freq: Every day | ORAL | Status: DC

## 2015-07-02 MED ORDER — TRAZODONE HCL 100 MG PO TABS
100.0000 mg | ORAL_TABLET | Freq: Every day | ORAL | Status: DC
  Administered 2015-07-02 – 2015-07-03 (×2): 100 mg via ORAL
  Filled 2015-07-02 (×4): qty 1

## 2015-07-02 MED ORDER — ACETAMINOPHEN 325 MG PO TABS: 650.0000 mg | ORAL_TABLET | Freq: Four times a day (QID) | ORAL | Status: DC | PRN

## 2015-07-02 MED ORDER — MAGNESIUM HYDROXIDE 400 MG/5ML PO SUSP: 30.0000 mL | Freq: Every day | ORAL | Status: DC | PRN

## 2015-07-02 MED ORDER — ONDANSETRON HCL 4 MG PO TABS: 4.0000 mg | ORAL_TABLET | Freq: Three times a day (TID) | ORAL | Status: DC | PRN

## 2015-07-02 MED ORDER — LISINOPRIL 10 MG PO TABS
10.0000 mg | ORAL_TABLET | Freq: Every day | ORAL | Status: DC
  Administered 2015-07-08: 10 mg via ORAL
  Filled 2015-07-02 (×10): qty 1

## 2015-07-02 NOTE — ED Notes (Signed)
Pt. Noted sleeping in room. No complaints or concerns voiced. No distress or abnormal behavior noted. Will continue to monitor with security cameras. Q 15 minute rounds continue. 

## 2015-07-02 NOTE — ED Notes (Signed)
Pt being transported to [facility] by police. She has called her family members to let them know where she is going.

## 2015-07-02 NOTE — Tx Team (Addendum)
Initial Interdisciplinary Treatment Plan   PATIENT STRESSORS: Financial difficulties Marital or family conflict Medication change or noncompliance   PATIENT STRENGTHS: Average or above average intelligence Physical Health Supportive family/friends   PROBLEM LIST: Problem List/Patient Goals Date to be addressed Date deferred Reason deferred Estimated date of resolution  Depression (post partum) 07/02/2015     Psychosis 07/02/2015     Med noncompliance due pregnancy 07/02/2015     Aggression toward family 07/02/2015     Limited resources 07/02/2015           "getting out of here" 07/04/15 [facility] 07/04/15                  DISCHARGE CRITERIA:  Improved stabilization in mood, thinking, and/or behavior Reduction of life-threatening or endangering symptoms to within safe limits  PRELIMINARY DISCHARGE PLAN: Outpatient therapy Return to previous living arrangement  PATIENT/FAMIILY INVOLVEMENT: This treatment plan has been presented to and reviewed with the patient, Dorothy Pham.  The patient and family have been given the opportunity to ask questions and make suggestions.  Cranford Mon 07/02/2015, 3:28 PM

## 2015-07-02 NOTE — Consult Note (Signed)
[facility] Consult   Reason for Consult:  Paranoia Referring Physician:  EDP Patient Identification: Dorothy Pham MRN:  [id number] Principal Diagnosis: Schizoaffective disorder, chronic condition with acute exacerbation ([place]) Diagnosis:   Patient Active Problem List   Diagnosis Date Noted  . Schizoaffective disorder, chronic condition with acute exacerbation ([place]) [F25.8] 07/02/2015    Priority: High  . Spontaneous vaginal delivery [O80] 04/16/2015  . Post term pregnancy, antepartum condition or complication [W26.3] 78/58/8502  . Murmur, cardiac [R01.1] 02/16/2015  . DOE (dyspnea on exertion) [R06.09] 02/16/2015  . Schizoaffective disorder ([place]) [F25.9] 01/30/2015  . Dysplasia of cervix, low grade (CIN 1) [N87.0] 04/21/2012  . Amniotic band syndrome--2013 pregnancy [O99.89] 09/24/2011  . Termination of pregnancy (fetus)--2013 due to anomalies [Z33.2] 09/24/2011  . Obesity [E66.9] 09/24/2011    Total Time spent with patient: 45 minutes  Subjective:   Dorothy Pham is a 30 y.o. female patient admitted with psychosis, instability.Marland Kitchen  HPI:  On admission:  30 y.o. female presenting to [facility] after being petitioned by her father for involuntary commitment. Pt stated "I am weary and my head is cloudy". "I just had a baby and I feel like I am losing my mind". "I am nervous and I feel overwhelm". "I was treated for post-partum". "I was stable on Abilify but I had to stop taking it because of the pregnancy". Pt reported that she took her Abilify two days ago. Pt denies SI and HI at this time. Pt reported that she has visual hallucinations and reported that she sees shadows. Pt stated "my head gets cloudy and I get dizzy". Pt is reporting multiple depressive symptoms and shared that her sleep and appetite have been poor. Pt did not report any alcohol or illicit substance use at this time.  Collateral information was gathered from pt's father who reported that pt has been paranoid and  "getting into it" with her mother and sister. He reported that pt has been crying and experiencing confusion. He reported that pt got into it with her sister today and broke the door down trying to get out of it. He shared that pt has been diagnosed with schizophrenia in the past and has been stable on her medication until her pregnancy. He shared that pt cared for her infant until her mental health decline and now her mother helps her care for the baby. Pt's mother also provided additional information and reported that pt is aware that she needs help. She shared that this has not happen in 15 years. She reported that pt is a good mother and has enjoyed caring for her baby.  Today:  Patient reports feeling "a little better" since the medication, Abilify, was started.  Evidently, she had been doing well until she stopped the Abilify due to her pregnancy, delivery in December.  Recently, she has not been able to care for her baby, mother is.  Patient does live alone, denies alcohol/drug use.  Denies suicidal/homicidal ideations but clearly responding to internal stimuli.  Past Psychiatric History: Schizoaffective disorder  Risk to Self: Suicidal Ideation: No Suicidal Intent: No Is patient at risk for suicide?: No Suicidal Plan?: No Access to Means: No What has been your use of drugs/alcohol within the last 12 months?: Pt denies. How many times?: 0 Other Self Harm Risks: Pt denies  Triggers for Past Attempts: None known Intentional Self Injurious Behavior: None Risk to Others: Homicidal Ideation: No Thoughts of Harm to Others: No Current Homicidal Intent: No Current Homicidal Plan: No Access  to Homicidal Means: No Identified Victim: N/A History of harm to others?: No Assessment of Violence: None Noted Violent Behavior Description: No violent behaviors observed. Pt is calm and cooperative at this time.  Does patient have access to weapons?: No Criminal Charges Pending?: No Does patient have a  court date: No Prior Inpatient Therapy: Prior Inpatient Therapy: No Prior Therapy Dates: N/A Prior Therapy Facilty/Provider(s): N/A Reason for Treatment: N/A Prior Outpatient Therapy: Prior Outpatient Therapy: Yes Prior Therapy Dates: Current  Prior Therapy Facilty/Provider(s): Dr. Grayce Sessions Reason for Treatment: Medication managment  Does patient have an ACCT team?: No Does patient have Intensive In-House Services?  : No Does patient have [facility] services? : No Does patient have P4CC services?: No  Past Medical History:  Past Medical History  Diagnosis Date  . Mental disorder   . Schizoaffective disorder   . Complication of anesthesia   . PONV (postoperative nausea and vomiting)   . Schizoaffective disorder   . Heart murmur   . Obesity   . Vaginal Pap smear, abnormal   . Hx of varicella     Past Surgical History  Procedure Laterality Date  . Breast surgery  2012    reduction  . Colposcopy     Family History:  Family History  Problem Relation Age of Onset  . Anesthesia problems Neg Hx   . Diabetes Father   . Arthritis Father   . Arthritis Mother   . Bipolar disorder Mother   . Diabetes Maternal Grandmother   . Hypertension Maternal Grandmother   . Hypertension Maternal Grandfather   . Diabetes Maternal Grandfather   . Diabetes Paternal Grandmother   . Schizophrenia Paternal Grandmother    Family Psychiatric  History: Unknown Social History:  History  Alcohol Use No     History  Drug Use No    Social History   Social History  . Marital Status: Single    Spouse Name: N/A  . Number of Children: N/A  . Years of Education: N/A   Social History Main Topics  . Smoking status: Never Smoker   . Smokeless tobacco: Never Used  . Alcohol Use: No  . Drug Use: No  . Sexual Activity: No   Other Topics Concern  . None   Social History Narrative   Additional Social History:    Allergies:  No Known Allergies  Labs:  Results for orders placed or performed  during the hospital encounter of 07/01/15 (from the past 48 hour(s))  Comprehensive metabolic panel     Status: Abnormal   Collection Time: 07/01/15  7:49 PM  Result Value Ref Range   Sodium 143 135 - 145 mmol/L   Potassium 3.5 3.5 - 5.1 mmol/L   Chloride 108 101 - 111 mmol/L   CO2 23 22 - 32 mmol/L   Glucose, Bld 105 (H) 65 - 99 mg/dL   BUN 11 6 - 20 mg/dL   Creatinine, Ser 0.72 0.44 - 1.00 mg/dL   Calcium 10.2 8.9 - 10.3 mg/dL   Total Protein 8.5 (H) 6.5 - 8.1 g/dL   Albumin 4.9 3.5 - 5.0 g/dL   AST 27 15 - 41 U/L   ALT 23 14 - 54 U/L   Alkaline Phosphatase 65 38 - 126 U/L   Total Bilirubin 0.7 0.3 - 1.2 mg/dL   GFR calc non Af Amer >60 >60 mL/min   GFR calc Af Amer >60 >60 mL/min    Comment: (NOTE) The eGFR has been calculated using the CKD EPI  equation. This calculation has not been validated in all clinical situations. eGFR's persistently <60 mL/min signify possible Chronic Kidney Disease.    Anion gap 12 5 - 15  Ethanol (ETOH)     Status: None   Collection Time: 07/01/15  7:49 PM  Result Value Ref Range   Alcohol, Ethyl (B) <5 <5 mg/dL    Comment:        LOWEST DETECTABLE LIMIT FOR SERUM ALCOHOL IS 5 mg/dL FOR MEDICAL PURPOSES ONLY   Salicylate level     Status: None   Collection Time: 07/01/15  7:49 PM  Result Value Ref Range   Salicylate Lvl <1.6 2.8 - 30.0 mg/dL  Acetaminophen level     Status: Abnormal   Collection Time: 07/01/15  7:49 PM  Result Value Ref Range   Acetaminophen (Tylenol), Serum <10 (L) 10 - 30 ug/mL    Comment:        THERAPEUTIC CONCENTRATIONS VARY SIGNIFICANTLY. A RANGE OF 10-30 ug/mL MAY BE AN EFFECTIVE CONCENTRATION FOR MANY PATIENTS. HOWEVER, SOME ARE BEST TREATED AT CONCENTRATIONS OUTSIDE THIS RANGE. ACETAMINOPHEN CONCENTRATIONS >150 ug/mL AT 4 HOURS AFTER INGESTION AND >50 ug/mL AT 12 HOURS AFTER INGESTION ARE OFTEN ASSOCIATED WITH TOXIC REACTIONS.   CBC     Status: Abnormal   Collection Time: 07/01/15  7:49 PM  Result  Value Ref Range   WBC 10.0 4.0 - 10.5 K/uL   RBC 3.96 3.87 - 5.11 MIL/uL   Hemoglobin 12.1 12.0 - 15.0 g/dL   HCT 35.6 (L) 36.0 - 46.0 %   MCV 89.9 78.0 - 100.0 fL   MCH 30.6 26.0 - 34.0 pg   MCHC 34.0 30.0 - 36.0 g/dL   RDW 13.6 11.5 - 15.5 %   Platelets 337 150 - 400 K/uL    Current Facility-Administered Medications  Medication Dose Route Frequency Provider Last Rate Last Dose  . ARIPiprazole (ABILIFY) tablet 20 mg  20 mg Oral QHS Antonietta Breach, PA-C      . LORazepam (ATIVAN) tablet 1 mg  1 mg Oral Q8H PRN Antonietta Breach, PA-C   1 mg at 07/01/15 2118  . ondansetron (ZOFRAN) tablet 4 mg  4 mg Oral Q8H PRN Antonietta Breach, PA-C      . zolpidem (AMBIEN) tablet 5 mg  5 mg Oral QHS PRN Antonietta Breach, PA-C       Current Outpatient Prescriptions  Medication Sig Dispense Refill  . ARIPiprazole (ABILIFY) 20 MG tablet Take 20 mg by mouth at bedtime. Reported on 07/01/2015    . diphenhydrAMINE (BENADRYL) 12.5 MG/5ML elixir Take 25 mg by mouth at bedtime as needed for sleep.    . ferrous sulfate 325 (65 FE) MG tablet Take 1 tablet (325 mg total) by mouth daily. (Patient not taking: Reported on 07/01/2015) 30 tablet 3  . ibuprofen (ADVIL,MOTRIN) 800 MG tablet Take 1 tablet (800 mg total) by mouth every 8 (eight) hours as needed. (Patient not taking: Reported on 07/01/2015) 50 tablet 1    Musculoskeletal: Strength & Muscle Tone: within normal limits Gait & Station: normal Patient leans: N/A  Psychiatric Specialty Exam: Review of Systems  Constitutional: Negative.   HENT: Negative.   Eyes: Negative.   Respiratory: Negative.   Cardiovascular: Negative.   Gastrointestinal: Negative.   Genitourinary: Negative.   Musculoskeletal: Negative.   Skin: Negative.   Neurological: Negative.   Endo/Heme/Allergies: Negative.   Psychiatric/Behavioral: Positive for hallucinations. The patient is nervous/anxious.     Blood pressure 150/95, pulse 80, temperature 98.1 F (36.7  C), temperature source Oral, resp.  rate 18, last menstrual period 05/28/2015, SpO2 100 %, unknown if currently breastfeeding.There is no weight on file to calculate BMI.  General Appearance: Disheveled  Eye Sport and exercise psychologist::  Fair  Speech:  Normal Rate  Volume:  Normal  Mood:  Dysphoric  Affect:  Flat  Thought Process:  Coherent  Orientation:  Full (Time, Place, and Person)  Thought Content:  Hallucinations: Auditory Visual  Suicidal Thoughts:  No  Homicidal Thoughts:  No  Memory:  Immediate;   Fair Recent;   Fair Remote;   Fair  Judgement:  Impaired  Insight:  Fair  Psychomotor Activity:  Decreased  Concentration:  Fair  Recall:  [employer] of Knowledge:Fair  Language: Good  Akathisia:  No  Handed:  Right  AIMS (if indicated):     Assets:  Housing Leisure Time Physical Health Resilience Social Support  ADL's:  Intact  Cognition: Impaired,  Mild  Sleep:      Treatment Plan Summary: Daily contact with patient to assess and evaluate symptoms and progress in treatment, Medication management and Plan schizoaffective disorder, chronic type, with exacerbation : -Crisis stabilization -Medication management:  Started Abilify 20 mg daily for mood stabilization and psychosis, Cogentin 1 mg at bedtime to prevent EPS, and Vistaril 25 mg TID PRN anxiety -Individual counseling  Disposition: Recommend psychiatric Inpatient admission when medically cleared.  Waylan Boga, NP 07/02/2015 6:50 AM Patient seen face-to-face for psychiatric evaluation, chart reviewed and case discussed with the physician extender and developed treatment plan. Reviewed the information documented and agree with the treatment plan. Corena Pilgrim, MD

## 2015-07-02 NOTE — Progress Notes (Signed)
Pt has two visitors in room at shift change.  They are "doing" her hair.  Introduced myself and asked if patient had any complaints.  No complaints.  Patient did state that she would like to know what all her meds are for the evening.  Advised pt that I would discuss with her as meds were due.  Pt. Pleasant and smiling.

## 2015-07-02 NOTE — Progress Notes (Signed)
Admission note:  Patient is a 30 yo female that presented to [facility] after being petitioned by her father.  Patient had a baby in December and had stopped taking her Abilify because of the pregnancy.  Patient reported she last took her abilify 2 days ago.  She denies SI/HI/AVH.  She is preoccupied and has some thought blocking.  She denies any alcohol, drug use or tobacco use.  Patient's father states patient has been paranoid and arguing with the family.  Patient also broke a door down at their house.  Patient has been diagnosed with schizophrenia in the past and had been stable until she stopped her medication.  Patient's mother has been helping her take care of the baby.  Patient's BP is high and she is refusing her lisinopril.  Her pulse is also high.  Patient had to be redirected numerous times because she was preoccupied with her paperwork.  Paperwork had to be removed and she was unable to sign due to her preoccupation and paranoia.  Patient's skin revealed several tatoos, however, had to be terminated due to patient's psychosis.  She is currently on the phone on the 500 yelling loudly.  She is irritable and paranoid.

## 2015-07-02 NOTE — ED Notes (Signed)
Pt woke up early this morning and was concerned about talking to a doctor and a Veterinary surgeon. She does not feel that she needs to be here. She was guarded and attention seeking for mild somatic complaints. She did not seem to understand why she is here. Currently she denies SI/HI and does not endorse AVH. She says little and appears to be irritable, so is perhaps responding to internal stimuli. Currently she is sleeping.

## 2015-07-02 NOTE — Progress Notes (Signed)
Patient ID: Dorothy Pham, female   DOB: 10-14-1985, 30 y.o.   MRN: [id number] PER STATE REGULATIONS 482.30  THIS CHART WAS REVIEWED FOR MEDICAL NECESSITY WITH RESPECT TO THE PATIENT'S ADMISSION/DURATION OF STAY.  NEXT REVIEW DATE:07/06/15  Loura Halt, RN, BSN CASE MANAGER

## 2015-07-02 NOTE — BH Assessment (Signed)
[facility] Assessment Progress Note  Per Thedore Mins, MD, this pt requires psychiatric hospitalization at this time.  Berneice Heinrich, RN, [facility] has assigned pt to [facility] Rm 507-1.  Pt is under IVC initiated by pt's father and upheld by Dr Jannifer Franklin; IVC documents have been faxed to [facility].  Pt's nurse, Diane, has been notified, and agrees to call report to [phone].  Pt is to be transported via Patent examiner.  Doylene Canning, MA Triage Specialist [phone]

## 2015-07-02 NOTE — Progress Notes (Signed)
Pt has been very agitated on the unit.  She refused to take PO medication to help calm her down.  She has attempted to walk off the unit multiple time.  She is very paranoid and states that she doesn't feel safe.  She finally signed paperwork for staff to be able to talk with father and other family members.  Father visiting and he has reassured her that she is safe and to to take the medication.  She agreed.  We will monitor the progress towards her goals.

## 2015-07-03 ENCOUNTER — Encounter (HOSPITAL_COMMUNITY): Payer: Self-pay | Admitting: Psychiatry

## 2015-07-03 DIAGNOSIS — F250 Schizoaffective disorder, bipolar type: Principal | ICD-10-CM

## 2015-07-03 MED ORDER — LORAZEPAM 2 MG/ML IJ SOLN: 1.0000 mg | Freq: Four times a day (QID) | INTRAMUSCULAR | Status: DC | PRN

## 2015-07-03 MED ORDER — LORAZEPAM 1 MG PO TABS
1.0000 mg | ORAL_TABLET | Freq: Four times a day (QID) | ORAL | Status: DC | PRN
  Administered 2015-07-04 – 2015-07-08 (×5): 1 mg via ORAL
  Filled 2015-07-03 (×5): qty 1

## 2015-07-03 NOTE — BHH Suicide Risk Assessment (Signed)
[facility] Admission Suicide Risk Assessment   Nursing information obtained from:    Demographic factors:    Current Mental Status:    Loss Factors:    Historical Factors:    Risk Reduction Factors:     Total Time spent with patient: 30 minutes Principal Problem: Schizoaffective disorder, bipolar type (HCC) Diagnosis:   Patient Active Problem List   Diagnosis Date Noted  . Schizoaffective disorder, bipolar type (HCC) [F25.0] 07/03/2015  . Spontaneous vaginal delivery [O80] 04/16/2015  . Post term pregnancy, antepartum condition or complication [O48.0] 04/15/2015  . Murmur, cardiac [R01.1] 02/16/2015  . DOE (dyspnea on exertion) [R06.09] 02/16/2015  . Dysplasia of cervix, low grade (CIN 1) [N87.0] 04/21/2012  . Amniotic band syndrome--2013 pregnancy [O99.89] 09/24/2011  . Termination of pregnancy (fetus)--2013 due to anomalies [Z33.2] 09/24/2011  . Obesity [E66.9] 09/24/2011   Subjective Data: Please see H&P.   Continued Clinical Symptoms:  Alcohol Use Disorder Identification Test Final Score (AUDIT): 0 The "Alcohol Use Disorders Identification Test", Guidelines for Use in Primary Care, Second Edition.  World Science writer [facility]). Score between 0-7:  no or low risk or alcohol related problems. Score between 8-15:  moderate risk of alcohol related problems. Score between 16-19:  high risk of alcohol related problems. Score 20 or above:  warrants further diagnostic evaluation for alcohol dependence and treatment.   CLINICAL FACTORS:   Unstable or Poor Therapeutic Relationship Previous Psychiatric Diagnoses and Treatments   Musculoskeletal: Strength & Muscle Tone: within normal limits Gait & Station: normal Patient leans: N/A  Psychiatric Specialty Exam: Review of Systems  Psychiatric/Behavioral: The patient is nervous/anxious.   All other systems reviewed and are negative.   Blood pressure 130/89, pulse 127, temperature 98.6 F (37 C), temperature source Oral, resp.  rate 16, height  (1.702 m), weight 93.441 kg (206 lb), unknown if currently breastfeeding.Body mass index is 32.26 kg/(m^2).                        Please see H&P.                                   COGNITIVE FEATURES THAT CONTRIBUTE TO RISK:  Closed-mindedness, Polarized thinking and Thought constriction (tunnel vision)    SUICIDE RISK:   Mild:  Suicidal ideation of limited frequency, intensity, duration, and specificity.  There are no identifiable plans, no associated intent, mild dysphoria and related symptoms, good self-control (both objective and subjective assessment), few other risk factors, and identifiable protective factors, including available and accessible social support.  PLAN OF CARE: Please see H&P.   I certify that inpatient services furnished can reasonably be expected to improve the patient's condition.   Rey Fors, MD 07/03/2015, 10:46 AM

## 2015-07-03 NOTE — Progress Notes (Signed)
Adult Psychoeducational Group Note  Date:  07/03/2015 Time:  9:30 PM  Group Topic/Focus:  Wrap-Up Group:   The focus of this group is to help patients review their daily goal of treatment and discuss progress on daily workbooks.  Participation Level:  Active  Participation Quality:  Appropriate  Affect:  Appropriate  Cognitive:  Appropriate  Insight: Appropriate  Engagement in Group:  Engaged  Modes of Intervention:  Discussion  Additional Comments: The patient express that she attended group.The patient also said that group was about support.  Octavio Manns 07/03/2015, 9:30 PM

## 2015-07-03 NOTE — BHH Group Notes (Signed)
BHH Group Notes:  (Nursing/MHT/Case Management/Adjunct)  Date:  07/03/2015  Time:  3:25 PM  Type of Therapy:  Nurse Education  Participation Level:  Active  Participation Quality:  Appropriate and Attentive  Affect:  Appropriate  Cognitive:  Alert and Appropriate  Insight:  Appropriate and Good  Engagement in Group:  Engaged  Modes of Intervention:  Discussion and Education  Summary of Progress/Problems: Topic was on Recovery.  Discussed the importance of continuing with the recovery process.  Patient was able to report one goal for recovery.  Patient was attentive and receptive.     Mickie Bail 07/03/2015, 3:25 PM

## 2015-07-03 NOTE — Plan of Care (Signed)
Problem: Ineffective individual coping Goal: STG: Patient will remain free from self harm Outcome: Progressing Pt denies self harm ideation.  15 min checks in place for patient safety.

## 2015-07-03 NOTE — H&P (Signed)
Psychiatric Admission Assessment Adult  Patient Identification: Dorothy Pham MRN:  [id number] Date of Evaluation:  07/03/2015 Chief Complaint:  " I have a chemical imbalance.'  Principal Diagnosis: Schizoaffective disorder, bipolar type ([place]) Diagnosis:   Patient Active Problem List   Diagnosis Date Noted  . Schizoaffective disorder, bipolar type ([place]) [F25.0] 07/03/2015  . Spontaneous vaginal delivery [O80] 04/16/2015  . Post term pregnancy, antepartum condition or complication [I09.7] 35/32/9924  . Murmur, cardiac [R01.1] 02/16/2015  . DOE (dyspnea on exertion) [R06.09] 02/16/2015  . Dysplasia of cervix, low grade (CIN 1) [N87.0] 04/21/2012  . Amniotic band syndrome--2013 pregnancy [O99.89] 09/24/2011  . Termination of pregnancy (fetus)--2013 due to anomalies [Z33.2] 09/24/2011  . Obesity [E66.9] 09/24/2011   History of Present Illness:: Dorothy Pham is a 30 y.o. AA female, who is single , lives in [place] by self , has a 29 month old baby , who is currently with her mother , is unemployed , has a hx of schizoaffective do , who  Presented  to [facility] after being petitioned by her father for involuntary commitment for aggressive behavior at [place].  Per initial notes in EHR " Pt stated "I am weary and my head is cloudy". "I just had a baby and I feel like I am losing my mind". "I am nervous and I feel overwhelm". "I was treated for post-partum". "I was stable on Abilify but I had to stop taking it because of the pregnancy". Pt reported that she took her Abilify two days ago. Pt denies SI and HI at this time. Pt reported that she has visual hallucinations and reported that she sees shadows. Pt stated "my head gets cloudy and I get dizzy". Pt is reporting multiple depressive symptoms and shared that her sleep and appetite have been poor. Pt did not report any alcohol or illicit substance use at this time.  Collateral information was gathered from pt's father who reported that pt has been paranoid  and "getting into it" with her mother and sister. He reported that pt has been crying and experiencing confusion. He reported that pt got into it with her sister today and broke the door down trying to get out of it. He shared that pt has been diagnosed with schizophrenia in the past and has been stable on her medication until her pregnancy. He shared that pt cared for her infant until her mental health decline and now her mother helps her care for the baby. Pt's mother also provided additional information and reported that pt is aware that she needs help. She shared that this has not happened in 15 years. She reported that pt is a good mother and has enjoyed caring for her baby. "  Patient seen and chart reviewed today .Discussed patient with treatment team. Pt today seen as paranoid , initially stated that she was just here for her having a chemical imbalance . Pt initially denied she had ever been to [place], but after Probation officer read the initial EHR notes to her out loud , she agreed that everything that has been documented there were right. Pt even though had reported depressive sx while in the ED ( as per previous notes) , denied sleep issues, sadness, appetite changes or SI to Probation officer. Pt kept repeating that she had a chemical imbalance after giving birth to the baby and just wanted to be back on her medications. Pt reports she has been taking good care of her baby " Dorothy Pham " with the help of her mother. Pt  denies any thoughts about harming the baby , and as per previous notes in EHR - mother also reports that pt is a good mother. Pt reports that she feels better now that she is back on her Abilify. Pt denied mood sx, denied AH/VH as well as any hx of trauma . Pt did report some anxiety sx in the past , but reports she did not have it now . Pt denied any previous hospitalizations , denied past suicide attempts. Pt denied hx of substance abuse.       Associated Signs/Symptoms: Depression Symptoms:   psychomotor agitation, anxiety, (Hypo) Manic Symptoms:  Distractibility, Impulsivity, Anxiety Symptoms:  anxiety sx on and off Psychotic Symptoms:  Paranoia, PTSD Symptoms: Negative Total Time spent with patient: 45 minutes  Past Psychiatric History: Pt with hx of schizoaffective disorder , follows up with [facility]. Pt reports she does not remember other medications that she had been tried on. Pt does report she was most recently on Abilify and that helps her, she had stopped taking it since she were pregnant. Pt denies past suicide attempts.  Is the patient at risk to self? Yes.    Has the patient been a risk to self in the past 6 months? Yes.    Has the patient been a risk to self within the distant past? Yes.    Is the patient a risk to others? Yes.    Has the patient been a risk to others in the past 6 months? Yes.    Has the patient been a risk to others within the distant past? Yes.     Prior Inpatient Therapy:   Prior Outpatient Therapy:    Alcohol Screening: 1. How often do you have a drink containing alcohol?: Never 9. Have you or someone else been injured as a result of your drinking?: No 10. Has a relative or friend or a doctor or another health worker been concerned about your drinking or suggested you cut down?: No Alcohol Use Disorder Identification Test Final Score (AUDIT): 0 Brief Intervention: AUDIT score less than 7 or less-screening does not suggest unhealthy drinking-brief intervention not indicated Substance Abuse History in the last 12 months:  No. Consequences of Substance Abuse: Negative Previous Psychotropic Medications: Yes , abilify Psychological Evaluations: No  Past Medical History:  Past Medical History  Diagnosis Date  . Mental disorder   . Schizoaffective disorder   . Complication of anesthesia   . PONV (postoperative nausea and vomiting)   . Schizoaffective disorder   . Heart murmur   . Obesity   . Vaginal Pap smear, abnormal   . Hx of  varicella     Past Surgical History  Procedure Laterality Date  . Breast surgery  2012    reduction  . Colposcopy     Family History:  Family History  Problem Relation Age of Onset  . Anesthesia problems Neg Hx   . Diabetes Father   . Arthritis Father   . Arthritis Mother   . Bipolar disorder Mother   . Diabetes Maternal Grandmother   . Hypertension Maternal Grandmother   . Hypertension Maternal Grandfather   . Diabetes Maternal Grandfather   . Diabetes Paternal Grandmother   . Schizophrenia Paternal Grandmother    Family Psychiatric  History: as documented above- mother has bipolar do, paternal grand father has schizophrenia. Pt denies alcoholism, drug abuse and suicide in her family. Tobacco Screening:Denies Social History: Pt is single, lives by self in [place] , has a 2 month  old baby , is unemployed , denies legal issues. History  Alcohol Use No     History  Drug Use No    Additional Social History: Does patient have children?: Yes How many children?: 1 How is patient's relationship with their children?: good                         Allergies:  No Known Allergies Lab Results:  Results for orders placed or performed during the hospital encounter of 07/01/15 (from the past 48 hour(s))  Comprehensive metabolic panel     Status: Abnormal   Collection Time: 07/01/15  7:49 PM  Result Value Ref Range   Sodium 143 135 - 145 mmol/L   Potassium 3.5 3.5 - 5.1 mmol/L   Chloride 108 101 - 111 mmol/L   CO2 23 22 - 32 mmol/L   Glucose, Bld 105 (H) 65 - 99 mg/dL   BUN 11 6 - 20 mg/dL   Creatinine, Ser 0.72 0.44 - 1.00 mg/dL   Calcium 10.2 8.9 - 10.3 mg/dL   Total Protein 8.5 (H) 6.5 - 8.1 g/dL   Albumin 4.9 3.5 - 5.0 g/dL   AST 27 15 - 41 U/L   ALT 23 14 - 54 U/L   Alkaline Phosphatase 65 38 - 126 U/L   Total Bilirubin 0.7 0.3 - 1.2 mg/dL   GFR calc non Af Amer >60 >60 mL/min   GFR calc Af Amer >60 >60 mL/min    Comment: (NOTE) The eGFR has been calculated  using the CKD EPI equation. This calculation has not been validated in all clinical situations. eGFR's persistently <60 mL/min signify possible Chronic Kidney Disease.    Anion gap 12 5 - 15  Ethanol (ETOH)     Status: None   Collection Time: 07/01/15  7:49 PM  Result Value Ref Range   Alcohol, Ethyl (B) <5 <5 mg/dL    Comment:        LOWEST DETECTABLE LIMIT FOR SERUM ALCOHOL IS 5 mg/dL FOR MEDICAL PURPOSES ONLY   Salicylate level     Status: None   Collection Time: 07/01/15  7:49 PM  Result Value Ref Range   Salicylate Lvl <8.3 2.8 - 30.0 mg/dL  Acetaminophen level     Status: Abnormal   Collection Time: 07/01/15  7:49 PM  Result Value Ref Range   Acetaminophen (Tylenol), Serum <10 (L) 10 - 30 ug/mL    Comment:        THERAPEUTIC CONCENTRATIONS VARY SIGNIFICANTLY. A RANGE OF 10-30 ug/mL MAY BE AN EFFECTIVE CONCENTRATION FOR MANY PATIENTS. HOWEVER, SOME ARE BEST TREATED AT CONCENTRATIONS OUTSIDE THIS RANGE. ACETAMINOPHEN CONCENTRATIONS >150 ug/mL AT 4 HOURS AFTER INGESTION AND >50 ug/mL AT 12 HOURS AFTER INGESTION ARE OFTEN ASSOCIATED WITH TOXIC REACTIONS.   CBC     Status: Abnormal   Collection Time: 07/01/15  7:49 PM  Result Value Ref Range   WBC 10.0 4.0 - 10.5 K/uL   RBC 3.96 3.87 - 5.11 MIL/uL   Hemoglobin 12.1 12.0 - 15.0 g/dL   HCT 35.6 (L) 36.0 - 46.0 %   MCV 89.9 78.0 - 100.0 fL   MCH 30.6 26.0 - 34.0 pg   MCHC 34.0 30.0 - 36.0 g/dL   RDW 13.6 11.5 - 15.5 %   Platelets 337 150 - 400 K/uL    Blood Alcohol level:  Lab Results  Component Value Date   ETH <5 38/25/0539    Metabolic Disorder Labs:  No results found for: HGBA1C, MPG No results found for: PROLACTIN No results found for: CHOL, TRIG, HDL, CHOLHDL, VLDL, LDLCALC  Current Medications: Current Facility-Administered Medications  Medication Dose Route Frequency Provider Last Rate Last Dose  . acetaminophen (TYLENOL) tablet 650 mg  650 mg Oral Q6H PRN Patrecia Pour, NP      . alum & mag  hydroxide-simeth (MAALOX/MYLANTA) 200-200-20 MG/5ML suspension 30 mL  30 mL Oral Q4H PRN Patrecia Pour, NP      . ARIPiprazole (ABILIFY) tablet 20 mg  20 mg Oral QHS Patrecia Pour, NP   20 mg at 07/02/15 2107  . benztropine (COGENTIN) tablet 1 mg  1 mg Oral QHS Patrecia Pour, NP   1 mg at 07/02/15 2236  . hydrOXYzine (ATARAX/VISTARIL) tablet 25 mg  25 mg Oral TID PRN Patrecia Pour, NP      . lisinopril (PRINIVIL,ZESTRIL) tablet 10 mg  10 mg Oral Daily Encarnacion Slates, NP      . lisinopril (PRINIVIL,ZESTRIL) tablet 20 mg  20 mg Oral Once Encarnacion Slates, NP   20 mg at 07/02/15 1516  . LORazepam (ATIVAN) tablet 1 mg  1 mg Oral Q6H PRN Ursula Alert, MD       Or  . LORazepam (ATIVAN) injection 1 mg  1 mg Intramuscular Q6H PRN Mehtaab Mayeda, MD      . magnesium hydroxide (MILK OF MAGNESIA) suspension 30 mL  30 mL Oral Daily PRN Patrecia Pour, NP      . ondansetron [facility]) tablet 4 mg  4 mg Oral Q8H PRN Patrecia Pour, NP      . traZODone (DESYREL) tablet 100 mg  100 mg Oral QHS Patrecia Pour, NP   100 mg at 07/02/15 2107   PTA Medications: Prescriptions prior to admission  Medication Sig Dispense Refill Last Dose  . ARIPiprazole (ABILIFY) 20 MG tablet Take 20 mg by mouth at bedtime. Reported on 07/01/2015   06/30/2015 at 2000  . diphenhydrAMINE (BENADRYL) 12.5 MG/5ML elixir Take 25 mg by mouth at bedtime as needed for sleep.   06/30/2015 at Unknown time  . ferrous sulfate 325 (65 FE) MG tablet Take 1 tablet (325 mg total) by mouth daily. (Patient not taking: Reported on 07/01/2015) 30 tablet 3 Not Taking at Unknown time  . ibuprofen (ADVIL,MOTRIN) 800 MG tablet Take 1 tablet (800 mg total) by mouth every 8 (eight) hours as needed. (Patient not taking: Reported on 07/01/2015) 50 tablet 1 Not Taking at Unknown time    Musculoskeletal: Strength & Muscle Tone: within normal limits Gait & Station: normal Patient leans: N/A  Psychiatric Specialty Exam: Physical Exam  Nursing note and vitals  reviewed. Constitutional:  I concur with PE done in ED.    Review of Systems  Psychiatric/Behavioral: Positive for depression (Reports she has a chemical imbalance). The patient is nervous/anxious.   All other systems reviewed and are negative.   Blood pressure 130/89, pulse 127, temperature 98.6 F (37 C), temperature source Oral, resp. rate 16, height '5\' 7"'  (1.702 m), weight 93.441 kg (206 lb), unknown if currently breastfeeding.Body mass index is 32.26 kg/(m^2).  General Appearance: Disheveled  Eye Sport and exercise psychologist::  Fair  Speech:  Normal Rate  Volume:  Normal  Mood:  Anxious  Affect:  Congruent  Thought Process:  Irrelevant  Orientation:  Full (Time, Place, and Person)  Thought Content:  Paranoid Ideation and Rumination  Suicidal Thoughts:  No  Homicidal Thoughts:  No  Memory:  Immediate;   Fair Recent;   Fair Remote;   Fair  Judgement:  Impaired  Insight:  Shallow  Psychomotor Activity:  Restlessness  Concentration:  Poor  Recall:  [employer] of Knowledge:Fair  Language: Fair  Akathisia:  No  Handed:  Right  AIMS (if indicated):     Assets:  Desire for Improvement  ADL's:  Intact  Cognition: WNL  Sleep:  Number of Hours: 4     Treatment Plan Summary:Dorothy Pham is a 30 y.o. AA female, who is single , lives in [place] by self , has a 74 month old baby , who is currently with her mother , is unemployed , has a hx of schizoaffective do , who  Presented  to [facility] after being petitioned by her father for involuntary commitment for aggressive behavior at [place].  Daily contact with patient to assess and evaluate symptoms and progress in treatment and Medication management   Patient will benefit from inpatient treatment and stabilization.  Estimated length of stay is 5-7 days.  Reviewed past medical records,treatment plan.  Will restart her Abilify as scheduled , currently on 20 mg po daily for mood sx/psychosis. Will offer Abilify Maintena IM 400 mg q 28 days - if she  agrees. Will add PRN medications as per agitation protocol. Will continue Trazodone 100 mg po qhs for sleep. Will continue to monitor vitals ,medication compliance and treatment side effects while patient is here.  Will monitor for medical issues as well as call consult as needed.  Reviewed labs cbc - wnl, cmp - wnl, BAL<5, ,TSH - 01/30/15- WNL, will order Lipid panel, hba1c, pregnancy test, uds , EKG for qtc if not already done. CSW will start working on disposition.  Patient to participate in therapeutic milieu .       Observation Level/Precautions:  15 minute checks    Psychotherapy: Individual and group therapy      Consultations:  Social worker  Discharge Concerns:  Stability and safety       I certify that inpatient services furnished can reasonably be expected to improve the patient's condition.    Ursula Alert, MD 2/21/201711:17 AM

## 2015-07-03 NOTE — BHH Counselor (Signed)
Adult Comprehensive Assessment  Patient ID: Dorothy Pham, female   DOB: 1985-09-21, 30 y.o.   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  Living/Environment/Situation:  Living Arrangements: Parent Living conditions (as described by patient or guardian): "Good" How long has patient lived in current situation?: "Quite awhile" What is atmosphere in current home: Comfortable, Supportive  Family History:  Does patient have children?: Yes How many children?: 1 How is patient's relationship with their children?: good  Childhood History:  By whom was/is the patient raised?: Both parents Description of patient's relationship with caregiver when they were a child: good Patient's description of current relationship with people who raised him/her: good How were you disciplined when you got in trouble as a child/adolescent?: "The usual" Does patient have siblings?: Yes Number of Siblings: 2 Description of patient's current relationship with siblings: Perfect Did patient suffer any verbal/emotional/physical/sexual abuse as a child?: No Did patient suffer from severe childhood neglect?: No Has patient ever been sexually abused/assaulted/raped as an adolescent or adult?: No Was the patient ever a victim of a crime or a disaster?: No Witnessed domestic violence?: No Has patient been effected by domestic violence as an adult?: No  Education:  Highest grade of school patient has completed: some college Learning disability?: No  Employment/Work Situation:   Employment situation: On disability Why is patient on disability: Would not say How long has patient been on disability: not sure Patient's job has been impacted by current illness: No What is the longest time patient has a held a job?: "not sure" Where was the patient employed at that time?: "not  exactly sure" Has patient ever been in the [employer]?: No Has patient ever served in combat?: No Are There Guns or Other Weapons in Your Home?: No  Financial Resources:   Surveyor, quantity resources: Writer Does patient have a Lawyer or guardian?: No  Alcohol/Substance Abuse:   Alcohol/Substance Abuse Treatment Hx: Denies past history Has alcohol/substance abuse ever caused legal problems?: No  Social Support System:   Conservation officer, nature Support System: Good Describe Community Support System: extended family Type of faith/religion: Baptist How does patient's faith help to cope with current illness?: I have faith in God and Jesus  Leisure/Recreation:   Leisure and Hobbies: excercise, getting out, moving around  Strengths/Needs:   What things does the patient do well?: braid hair In what areas does patient struggle / problems for patient: "not gonna say too much about that because I am a positive person"  Discharge Plan:   Does patient have access to transportation?: Yes Will patient be returning to same living situation after discharge?: Yes Currently receiving community mental health services:  Soil scientist of Care) Does patient have financial barriers related to discharge medications?: No  Summary/Recommendations:   Summary and Recommendations (to be completed by the evaluator): Dorothy Pham is a 30 YO AA female diagnosed with Schizoaffective D/O, Bipolar type, who presents with VH, disorganization and paranoia after having been off of her meds for the past year due to pregnancy and delivery of her first child.  She presents as guarded and suspicious, making it difficult to get information during this interview.  She can benefit from crises stabilization, madeication management, therapeutic milieu and referral for services.  Dorothy Pham. 07/03/2015

## 2015-07-03 NOTE — Progress Notes (Signed)
DAR NOTE: Patient presents with anxious affect and depressed mood.  Denies pain, auditory and visual hallucinations.  Rates depression at 0, hopelessness at 0, and anxiety at 0.  Maintained on routine safety checks.  Medications given as prescribed.  Support and encouragement offered as needed.  Attended group and participated.  States goal for today is "leaving out of this care."  Patient remained isolative to her room.  Refused medication and medical prescription (Urinalysis, drug screen, and pregnancy test).

## 2015-07-03 NOTE — BHH Group Notes (Signed)
BHH LCSW Group Therapy  07/03/2015 1:15 pm  Type of Therapy: Process Group Therapy  Participation Level:  Active  Participation Quality:  Appropriate  Affect:  Flat  Cognitive:  Oriented  Insight:  Improving  Engagement in Group:  Limited  Engagement in Therapy:  Limited  Modes of Intervention:  Activity, Clarification, Education, Problem-solving and Support  Summary of Progress/Problems: Today's group addressed the issue of overcoming obstacles.  Patients were asked to identify their biggest obstacle post d/c that stands in the way of their on-going success, and then problem solve as to how to manage this. Stayed the entire time.  Alternated between sleeping and staring.  Reluctant to participate.  No contributions of note.  Ida Rogue 07/03/2015   2:32 PM

## 2015-07-04 LAB — PREGNANCY, URINE: PREG TEST UR: NEGATIVE

## 2015-07-04 LAB — URINALYSIS W MICROSCOPIC (NOT AT ARMC)
Bilirubin Urine: NEGATIVE
Glucose, UA: NEGATIVE mg/dL
HGB URINE DIPSTICK: NEGATIVE
Ketones, ur: NEGATIVE mg/dL
Leukocytes, UA: NEGATIVE
Nitrite: NEGATIVE
Protein, ur: NEGATIVE mg/dL
SPECIFIC GRAVITY, URINE: 1.01 (ref 1.005–1.030)
pH: 6 (ref 5.0–8.0)

## 2015-07-04 LAB — RAPID URINE DRUG SCREEN, HOSP PERFORMED
Amphetamines: NOT DETECTED
BARBITURATES: NOT DETECTED
Benzodiazepines: NOT DETECTED
Cocaine: NOT DETECTED
OPIATES: NOT DETECTED
TETRAHYDROCANNABINOL: NOT DETECTED

## 2015-07-04 MED ORDER — ARIPIPRAZOLE 15 MG PO TABS
25.0000 mg | ORAL_TABLET | Freq: Every day | ORAL | Status: DC
  Administered 2015-07-04: 25 mg via ORAL
  Filled 2015-07-04 (×2): qty 1

## 2015-07-04 MED ORDER — TRAZODONE HCL 150 MG PO TABS
150.0000 mg | ORAL_TABLET | Freq: Every day | ORAL | Status: DC
  Administered 2015-07-06 – 2015-07-07 (×2): 150 mg via ORAL
  Administered 2015-07-09: 75 mg via ORAL
  Filled 2015-07-04 (×9): qty 1

## 2015-07-04 NOTE — Plan of Care (Signed)
Problem: Alteration in mood Goal: LTG-Patient reports reduction in suicidal thoughts (Patient reports reduction in suicidal thoughts and is able to verbalize a safety plan for whenever patient is feeling suicidal)  Outcome: Progressing Pt denies SI and verbally contracts for safety.       

## 2015-07-04 NOTE — BHH Group Notes (Signed)
[facility] Group Therapy  07/04/2015 , 1:51 PM    Type of Therapy:  [facility] [employer] Presentation  Participation Level:  Active  Participation Quality:  Attentive  Affect:  Blunted  Cognitive:  Oriented  Insight:  Limited  Engagement in Therapy:  Engaged  Modes of Intervention:  Discussion, Education and Socialization  Summary of Progress/Problems:  Onalee Hua from [facility] came to present his recovery story and play the guitar.  Opened the door to the group several times.  Stood there.  Eventually walked away.  Appears confused, disoriented.  Daryel Gerald B 07/04/2015 , 1:51 PM

## 2015-07-04 NOTE — Progress Notes (Signed)
[facility] MD Progress Note  07/04/2015 11:09 AM Lynesha Bango  MRN:  [id number] Subjective:  Pt states "I feel fine.'  Objective; Tryphena Aber is a 30 y.o. AA female, who is single , lives in [place] by self , has a 18 month old baby , who is currently with her mother , is unemployed , has a hx of schizoaffective do , who Presented to [facility] after being petitioned by her father for involuntary commitment for aggressive behavior at [place].  Patient seen and chart reviewed.Discussed patient with treatment team.  Pt appears paranoid , guarded, response to questions are mostly in monosyllables and she appears to be anxious to share a lot of information about herself. Pt denies any concerns , however per staff pt has been seen as responding to internal stimuli , is paranoid to the point that she did not sleep all that well last night due to her having a room mate , kept staring at her throughout the night. Pt has been tolerating her medications well - will continues to encourage and support.    Principal Problem: Schizoaffective disorder, bipolar type (HCC) Diagnosis:   Patient Active Problem List   Diagnosis Date Noted  . Schizoaffective disorder, bipolar type (HCC) [F25.0] 07/03/2015  . Spontaneous vaginal delivery [O80] 04/16/2015  . Post term pregnancy, antepartum condition or complication [O48.0] 04/15/2015  . Murmur, cardiac [R01.1] 02/16/2015  . DOE (dyspnea on exertion) [R06.09] 02/16/2015  . Dysplasia of cervix, low grade (CIN 1) [N87.0] 04/21/2012  . Amniotic band syndrome--2013 pregnancy [O99.89] 09/24/2011  . Termination of pregnancy (fetus)--2013 due to anomalies [Z33.2] 09/24/2011  . Obesity [E66.9] 09/24/2011   Total Time spent with patient: 30 minutes  Past Psychiatric History: Pt with hx of schizoaffective disorder , follows up with [facility]. Pt reports she does not remember other medications that she had been tried on. Pt does report she was most recently on Abilify and that helps  her, she had stopped taking it since she were pregnant. Pt denies past suicide attempts.   Past Medical History:  Past Medical History  Diagnosis Date  . Mental disorder   . Schizoaffective disorder   . Complication of anesthesia   . PONV (postoperative nausea and vomiting)   . Schizoaffective disorder   . Heart murmur   . Obesity   . Vaginal Pap smear, abnormal   . Hx of varicella     Past Surgical History  Procedure Laterality Date  . Breast surgery  2012    reduction  . Colposcopy     Family History:  Family History  Problem Relation Age of Onset  . Anesthesia problems Neg Hx   . Diabetes Father   . Arthritis Father   . Arthritis Mother   . Bipolar disorder Mother   . Diabetes Maternal Grandmother   . Hypertension Maternal Grandmother   . Hypertension Maternal Grandfather   . Diabetes Maternal Grandfather   . Diabetes Paternal Grandmother   . Schizophrenia Paternal Grandmother    Family Psychiatric  History: mother has bipolar do, paternal grand father has schizophrenia. Pt denies alcoholism, drug abuse and suicide in her family Social History: Pt is single, lives by self in [place] , has a 37 month old baby , is unemployed , denies legal issues. History  Alcohol Use No     History  Drug Use No    Social History   Social History  . Marital Status: Single    Spouse Name: N/A  . Number of Children: N/A  .  Years of Education: N/A   Social History Main Topics  . Smoking status: Never Smoker   . Smokeless tobacco: Never Used  . Alcohol Use: No  . Drug Use: No  . Sexual Activity: No   Other Topics Concern  . None   Social History Narrative   Additional Social History:                         Sleep: poor as observed  Appetite:  Fair  Current Medications: Current Facility-Administered Medications  Medication Dose Route Frequency Provider Last Rate Last Dose  . acetaminophen (TYLENOL) tablet 650 mg  650 mg Oral Q6H PRN Charm Rings, NP       . alum & mag hydroxide-simeth (MAALOX/MYLANTA) 200-200-20 MG/5ML suspension 30 mL  30 mL Oral Q4H PRN Charm Rings, NP      . ARIPiprazole (ABILIFY) tablet 25 mg  25 mg Oral QHS Dhani Imel, MD      . benztropine (COGENTIN) tablet 1 mg  1 mg Oral QHS Charm Rings, NP   1 mg at 07/02/15 2236  . hydrOXYzine (ATARAX/VISTARIL) tablet 25 mg  25 mg Oral TID PRN Charm Rings, NP      . lisinopril (PRINIVIL,ZESTRIL) tablet 10 mg  10 mg Oral Daily Sanjuana Kava, NP      . lisinopril (PRINIVIL,ZESTRIL) tablet 20 mg  20 mg Oral Once Sanjuana Kava, NP   20 mg at 07/02/15 1516  . LORazepam (ATIVAN) tablet 1 mg  1 mg Oral Q6H PRN Jomarie Longs, MD       Or  . LORazepam (ATIVAN) injection 1 mg  1 mg Intramuscular Q6H PRN Latausha Flamm, MD      . magnesium hydroxide (MILK OF MAGNESIA) suspension 30 mL  30 mL Oral Daily PRN Charm Rings, NP      . ondansetron (ZOFRAN) tablet 4 mg  4 mg Oral Q8H PRN Charm Rings, NP      . traZODone (DESYREL) tablet 150 mg  150 mg Oral QHS Jomarie Longs, MD        Lab Results: No results found for this or any previous visit (from the past 48 hour(s)).  Blood Alcohol level:  Lab Results  Component Value Date   ETH <5 07/01/2015    Physical Findings: AIMS: Facial and Oral Movements Muscles of Facial Expression: None, normal Lips and Perioral Area: None, normal Jaw: None, normal Tongue: None, normal,Extremity Movements Upper (arms, wrists, hands, fingers): None, normal Lower (legs, knees, ankles, toes): None, normal, Trunk Movements Neck, shoulders, hips: None, normal, Overall Severity Severity of abnormal movements (highest score from questions above): None, normal Incapacitation due to abnormal movements: None, normal Patient's awareness of abnormal movements (rate only patient's report): No Awareness, Dental Status Current problems with teeth and/or dentures?: No Does patient usually wear dentures?: No  CIWA:    COWS:      Musculoskeletal: Strength & Muscle Tone: within normal limits Gait & Station: normal Patient leans: N/A  Psychiatric Specialty Exam: Review of Systems  Psychiatric/Behavioral: The patient is nervous/anxious and has insomnia.   All other systems reviewed and are negative.   Blood pressure 130/69, pulse 102, temperature 97.5 F (36.4 C), temperature source Oral, resp. rate 20, height  (1.702 m), weight 93.441 kg (206 lb), unknown if currently breastfeeding.Body mass index is 32.26 kg/(m^2).  General Appearance: Disheveled  Eye Contact::  Fair  Speech:  Slow  Volume:  Decreased  Mood:  Anxious  Affect:  Congruent  Thought Process:  Disorganized  Orientation:  Full (Time, Place, and Person)  Thought Content:  Paranoid Ideation seen as responding to internal stimuli  Suicidal Thoughts:  No  Homicidal Thoughts:  No  Memory:  Immediate;   Fair Recent;   Fair Remote;   Fair  Judgement:  Impaired  Insight:  Shallow  Psychomotor Activity:  Decreased  Concentration:  Poor  Recall:  [employer] of Knowledge:Fair  Language: Fair  Akathisia:  No  Handed:  Right  AIMS (if indicated):     Assets:  Desire for Improvement Physical Health Social Support  ADL's:  Intact  Cognition: WNL  Sleep:  Number of Hours: 4.75   Treatment Plan Summary:Keilany Truxillo is a 30 y.o. AA female, who is single , lives in [place] by self , has a 91 month old baby , who is currently with her mother , is unemployed , has a hx of schizoaffective do , who Presented to [facility] after being petitioned by her father for involuntary commitment for aggressive behavior at [place].Pt today continues to be observed as guarded, paranoid as well as having sleep issues. Will continue treatment.  Daily contact with patient to assess and evaluate symptoms and progress in treatment and Medication management  Will increase Abilify to 25  mg po daily for mood sx/psychosis. Will offer Abilify Maintena IM 400 mg q 28 days - if  she agrees. Will add PRN medications as per agitation protocol. Will increase Trazodone to 150 mg po qhs for sleep. Will continue to monitor vitals ,medication compliance and treatment side effects while patient is here.  Will monitor for medical issues as well as call consult as needed.  Reviewed labs cbc - wnl, cmp - wnl, BAL<5, ,TSH - 01/30/15- WNL,  ordererd Lipid panel, hba1c, pregnancy test, uds , EKG for qtc - BUT PATIENT REFUSED ALL LABS/EKG. CSW will start working on disposition.  Patient to participate in therapeutic milieu .     Sonali Wivell, MD 07/04/2015, 11:09 AM

## 2015-07-04 NOTE — Progress Notes (Signed)
Adult Psychoeducational Group Note  Date:  07/04/2015 Time:  10:41 PM  Group Topic/Focus:  Wrap-Up Group:   The focus of this group is to help patients review their daily goal of treatment and discuss progress on daily workbooks.  Participation Level:  Minimal  Participation Quality:  Appropriate  Affect:  Flat  Cognitive:  Appropriate  Insight: Limited  Engagement in Group:  Limited  Modes of Intervention:  Socialization and Support  Additional Comments:  Patient attended and participated in group tonight. She reports that the best part of her day was being able to get herself back on track.  Lita Mains [facility] 07/04/2015, 10:41 PM

## 2015-07-04 NOTE — Progress Notes (Signed)
DAR NOTE: Patient mood and affect remained labile.  Denies pain, auditory and visual hallucinations.  Rates depression at 0, hopelessness at 0, and anxiety at 0.  Maintained on routine safety checks.  Medications given as prescribed.  Support and encouragement offered as needed.  Attended group and participated.  States goal for today is "taking medication and getting back stable."  Patient is restless and agitated.  Demanding to see her mother and to get discharged.  Patient redirected several times with poor result.  Refused to attend group but continues to demand using the phone while group is in session.    Ativan 1 mg given for agitation with good effect.

## 2015-07-04 NOTE — Tx Team (Signed)
Interdisciplinary Treatment Plan Update (Adult)  Date:  07/04/2015   Time Reviewed:  11:22 AM   Progress in Treatment: Attending groups: Yes. Participating in groups:  Yes. Taking medication as prescribed:  Yes. Tolerating medication:  Yes. Family/Significant other contact made:  No Patient understands diagnosis:  No  Limited insight Discussing patient identified problems/goals with staff:  Yes, see initial care plan. Medical problems stabilized or resolved:  Yes. Denies suicidal/homicidal ideation: Yes. Issues/concerns per patient self-inventory:  No. Other:  New problem(s) identified:  Discharge Plan or Barriers: see below  Reason for Continuation of Hospitalization: Hallucinations Medication stabilization Other; describe Paranoia, Disorganization  Comments: Dorothy Pham is a 30 y.o. AA female, who is single , lives in [place] by self , has a 71 month old baby , who is currently with her mother , is unemployed , has a hx of schizoaffective do , who Presented to [facility] after being petitioned by her father for involuntary commitment for aggressive behavior at [place].Pt today continues to be observed as guarded, paranoid as well as having sleep issues. Will continue treatment.  Daily contact with patient to assess and evaluate symptoms and progress in treatment and Medication management  Will increase Abilify to 25 mg po daily for mood sx/psychosis. Will offer Abilify Maintena IM 400 mg q 28 days - if she agrees. Will add PRN medications as per agitation protocol. Will increase Trazodone to 150 mg po qhs for sleep.  Estimated length of stay: 4-5 days  New goal(s):  Review of initial/current patient goals per problem list:   Review of initial/current patient goals per problem list:  1. Goal(s): Patient will participate in aftercare plan   Met: Yes   Target date: 3-5 days post admission date   As evidenced by: Patient will participate within aftercare plan AEB aftercare  provider and housing plan at discharge being identified. 07/04/15:  Plans to return home, follow up outpt         5. Goal(s): Patient will demonstrate decreased signs of psychosis  * Met: No  * Target date: 3-5 days post admission date  * As evidenced by: Patient will demonstrate decreased frequency of AVH or return to baseline function 07/04/15:  Pt was not taking meds for the past year.  Presents with symptoms of paranoia, disorganization, VH.           Attendees: Patient:  07/04/2015 11:22 AM   Family:   07/04/2015 11:22 AM   Physician:  Ursula Alert, MD 07/04/2015 11:22 AM   Nursing:   Manuella Ghazi, RN 07/04/2015 11:22 AM   CSW:    Roque Lias, LCSW   07/04/2015 11:22 AM   Other:  07/04/2015 11:22 AM   Other:   07/04/2015 11:22 AM   Other:  Lars Pinks, Nurse CM 07/04/2015 11:22 AM   Other:   07/04/2015 11:22 AM   Other:  Norberto Sorenson, [place]  07/04/2015 11:22 AM   Other:  07/04/2015 11:22 AM   Other:  07/04/2015 11:22 AM   Other:  07/04/2015 11:22 AM   Other:  07/04/2015 11:22 AM   Other:  07/04/2015 11:22 AM   Other:   07/04/2015 11:22 AM    Scribe for Treatment Team:   Trish Mage, 07/04/2015 11:22 AM

## 2015-07-04 NOTE — Progress Notes (Signed)
D: Pt has apprehensive affect and she describes her mood as "pretty good."  She reports her goal is "getting out of here, getting medications well."  Pt reports she talked to the physician earlier today and that it "went well."  Pt denies SI/HI, denies hallucinations, denies pain.  Pt has been visible in milieu interacting with peers and staff appropriately.  Pt attended evening group.   A: Introduced self to pt.  Met with pt and offered support and encouragement.  Actively listened to pt.  Medications offered per order.   R: Pt is compliant with medications except for Cogentin, which she refused despite encouragement from staff.  Pt verbally contracts for safety.  Will continue to monitor and assess.

## 2015-07-04 NOTE — BHH Suicide Risk Assessment (Signed)
[facility] INPATIENT:  Family/Significant Other Suicide Prevention Education  Suicide Prevention Education:  Education Completed; No one has been identified by the patient as the family member/significant other with whom the patient will be residing, and identified as the person(s) who will aid the patient in the event of a mental health crisis (suicidal ideations/suicide attempt).  With written consent from the patient, the family member/significant other has been provided the following suicide prevention education, prior to the and/or following the discharge of the patient.  The suicide prevention education provided includes the following:  Suicide risk factors  Suicide prevention and interventions  National Suicide Hotline telephone number  [facility] assessment telephone number  [facility] Emergency Assistance 911  [facility] and/or Residential Mobile Crisis Unit telephone number  Request made of family/significant other to:  Remove weapons (e.g., guns, rifles, knives), all items previously/currently identified as safety concern.    Remove drugs/medications (over-the-counter, prescriptions, illicit drugs), all items previously/currently identified as a safety concern.  The family member/significant other verbalizes understanding of the suicide prevention education information provided.  The family member/significant other agrees to remove the items of safety concern listed above. The patient did not endorse SI at the time of admission, nor did the patient c/o SI during the stay here.  SPE not required. However, I did speak with Ms Gaye Alken, mother, [phone] about a crises plan.  Daryel Gerald B 07/04/2015, 3:18 PM

## 2015-07-05 LAB — LIPID PANEL
CHOL/HDL RATIO: 3.8 ratio
CHOLESTEROL: 173 mg/dL (ref 0–200)
HDL: 46 mg/dL (ref >40)
LDL CALC: 106 mg/dL — AB (ref 0–99)
TRIGLYCERIDES: 107 mg/dL (ref <150)
VLDL: 21 mg/dL (ref 0–40)

## 2015-07-05 LAB — URINE CULTURE
Culture: 4000
Special Requests: NORMAL

## 2015-07-05 MED ORDER — ARIPIPRAZOLE 15 MG PO TABS
30.0000 mg | ORAL_TABLET | Freq: Every day | ORAL | Status: DC
  Administered 2015-07-05: 25 mg via ORAL
  Administered 2015-07-06 – 2015-07-09 (×4): 30 mg via ORAL
  Filled 2015-07-05 (×8): qty 2

## 2015-07-05 NOTE — BHH Group Notes (Signed)
[facility] Group Notes:  (Nursing/MHT/Case Management/Adjunct)  Date:  07/05/2015    Time:  0930 Type of Therapy:  Nurse Education  Participation Level:  Active  Participation Quality:  Appropriate and Attentive  Affect:  Appropriate  Cognitive:  Alert and Appropriate  Insight:  Appropriate and Good  Engagement in Group:  Engaged  Modes of Intervention:  Activity, Discussion, Education and Exploration  Summary of Progress/Problems: Topic was on leisure and lifestyle changes. Discussed the importance of choosing a healthy leisure activities. Group encouraged to surround themselves with positive and healthy group/support system when changing to a healthy lifestyle. Patient was receptive and contributed.   Mickie Bail 07/05/2015, 3:39 PM

## 2015-07-05 NOTE — Progress Notes (Signed)
Adult Psychoeducational Group Note  Date:  07/05/2015 Time:  9:54 PM  Group Topic/Focus:  Wrap-Up Group:   The focus of this group is to help patients review their daily goal of treatment and discuss progress on daily workbooks.  Participation Level:  Active  Participation Quality:  Appropriate and Attentive  Affect:  Appropriate  Cognitive:  Appropriate  Insight: Appropriate  Engagement in Group:  Engaged  Modes of Intervention:  Discussion  Additional Comments:  Pt rated her day a 9 out 10. Pt goal for tomorrow is to be released and to focus on "being me".   Merlinda Frederick 07/05/2015, 9:54 PM

## 2015-07-05 NOTE — Progress Notes (Signed)
[facility] MD Progress Note  07/05/2015 11:58 AM Dorothy Pham  MRN:  [id number] Subjective:  Pt states "I feel fine.I do not want any other mood stabilizer. I will take ativan.'   Objective; Dorothy Pham is a 30 y.o. AA female, who is single , lives in [place] by self , has a 52 month old baby , who is currently with her mother , is unemployed , has a hx of schizoaffective do , who Presented to [facility] after being petitioned by her father for involuntary commitment for aggressive behavior at [place].  Patient seen and chart reviewed.Discussed patient with treatment team.  Pt continues to appear paranoid , guarded, vaguely irritable . Pt continues to refuse the addition of another medication like a mood stabilizer. Per staff - pt continues to be irritable, demanding often and needs a lot of redirection. Pt has been tolerating her medications well - will continues to encourage and support.    Principal Problem: Schizoaffective disorder, bipolar type (HCC) Diagnosis:   Patient Active Problem List   Diagnosis Date Noted  . Schizoaffective disorder, bipolar type (HCC) [F25.0] 07/03/2015  . Spontaneous vaginal delivery [O80] 04/16/2015  . Post term pregnancy, antepartum condition or complication [O48.0] 04/15/2015  . Murmur, cardiac [R01.1] 02/16/2015  . DOE (dyspnea on exertion) [R06.09] 02/16/2015  . Dysplasia of cervix, low grade (CIN 1) [N87.0] 04/21/2012  . Amniotic band syndrome--2013 pregnancy [O99.89] 09/24/2011  . Termination of pregnancy (fetus)--2013 due to anomalies [Z33.2] 09/24/2011  . Obesity [E66.9] 09/24/2011   Total Time spent with patient: 30 minutes  Past Psychiatric History: Pt with hx of schizoaffective disorder , follows up with [facility]. Pt reports she does not remember other medications that she had been tried on. Pt does report she was most recently on Abilify and that helps her, she had stopped taking it since she were pregnant. Pt denies past suicide attempts.   Past  Medical History:  Past Medical History  Diagnosis Date  . Mental disorder   . Schizoaffective disorder   . Complication of anesthesia   . PONV (postoperative nausea and vomiting)   . Schizoaffective disorder   . Heart murmur   . Obesity   . Vaginal Pap smear, abnormal   . Hx of varicella     Past Surgical History  Procedure Laterality Date  . Breast surgery  2012    reduction  . Colposcopy     Family History:  Family History  Problem Relation Age of Onset  . Anesthesia problems Neg Hx   . Diabetes Father   . Arthritis Father   . Arthritis Mother   . Bipolar disorder Mother   . Diabetes Maternal Grandmother   . Hypertension Maternal Grandmother   . Hypertension Maternal Grandfather   . Diabetes Maternal Grandfather   . Diabetes Paternal Grandmother   . Schizophrenia Paternal Grandmother    Family Psychiatric  History: mother has bipolar do, paternal grand father has schizophrenia. Pt denies alcoholism, drug abuse and suicide in her family Social History: Pt is single, lives by self in [place] , has a 56 month old baby , is unemployed , denies legal issues. History  Alcohol Use No     History  Drug Use No    Social History   Social History  . Marital Status: Single    Spouse Name: N/A  . Number of Children: N/A  . Years of Education: N/A   Social History Main Topics  . Smoking status: Never Smoker   . Smokeless tobacco:  Never Used  . Alcohol Use: No  . Drug Use: No  . Sexual Activity: No   Other Topics Concern  . None   Social History Narrative   Additional Social History:                         Sleep: Fair  Appetite:  Fair  Current Medications: Current Facility-Administered Medications  Medication Dose Route Frequency Provider Last Rate Last Dose  . acetaminophen (TYLENOL) tablet 650 mg  650 mg Oral Q6H PRN Charm Rings, NP      . alum & mag hydroxide-simeth (MAALOX/MYLANTA) 200-200-20 MG/5ML suspension 30 mL  30 mL Oral Q4H PRN Charm Rings, NP      . ARIPiprazole (ABILIFY) tablet 30 mg  30 mg Oral QHS Ishana Blades, MD      . benztropine (COGENTIN) tablet 1 mg  1 mg Oral QHS Charm Rings, NP   1 mg at 07/02/15 2236  . hydrOXYzine (ATARAX/VISTARIL) tablet 25 mg  25 mg Oral TID PRN Charm Rings, NP      . lisinopril (PRINIVIL,ZESTRIL) tablet 10 mg  10 mg Oral Daily Sanjuana Kava, NP      . lisinopril (PRINIVIL,ZESTRIL) tablet 20 mg  20 mg Oral Once Sanjuana Kava, NP   20 mg at 07/02/15 1516  . LORazepam (ATIVAN) tablet 1 mg  1 mg Oral Q6H PRN Jomarie Longs, MD   1 mg at 07/04/15 2110   Or  . LORazepam (ATIVAN) injection 1 mg  1 mg Intramuscular Q6H PRN Travis Purk, MD      . magnesium hydroxide (MILK OF MAGNESIA) suspension 30 mL  30 mL Oral Daily PRN Charm Rings, NP      . ondansetron [facility]) tablet 4 mg  4 mg Oral Q8H PRN Charm Rings, NP      . traZODone (DESYREL) tablet 150 mg  150 mg Oral QHS Jomarie Longs, MD   150 mg at 07/04/15 2200    Lab Results:  Results for orders placed or performed during the hospital encounter of 07/02/15 (from the past 48 hour(s))  Urine rapid drug screen (hosp performed)     Status: None   Collection Time: 07/04/15 11:15 AM  Result Value Ref Range   Opiates NONE DETECTED NONE DETECTED   Cocaine NONE DETECTED NONE DETECTED   Benzodiazepines NONE DETECTED NONE DETECTED   Amphetamines NONE DETECTED NONE DETECTED   Tetrahydrocannabinol NONE DETECTED NONE DETECTED   Barbiturates NONE DETECTED NONE DETECTED    Comment:        DRUG SCREEN FOR MEDICAL PURPOSES ONLY.  IF CONFIRMATION IS NEEDED FOR ANY PURPOSE, NOTIFY LAB WITHIN 5 DAYS.        LOWEST DETECTABLE LIMITS FOR URINE DRUG SCREEN Drug Class       Cutoff (ng/mL) Amphetamine      1000 Barbiturate      200 Benzodiazepine   200 Tricyclics       300 Opiates          300 Cocaine          300 THC              50 Performed at [facility]   Pregnancy, urine     Status: None   Collection Time:  07/04/15 11:15 AM  Result Value Ref Range   Preg Test, Ur NEGATIVE NEGATIVE    Comment:  THE SENSITIVITY OF THIS METHODOLOGY IS >20 mIU/mL. Performed at [facility]   Urinalysis with microscopic (not at [facility])     Status: Abnormal   Collection Time: 07/04/15 11:15 AM  Result Value Ref Range   Color, Urine YELLOW YELLOW   APPearance CLEAR CLEAR   Specific Gravity, Urine 1.010 1.005 - 1.030   pH 6.0 5.0 - 8.0   Glucose, UA NEGATIVE NEGATIVE mg/dL   Hgb urine dipstick NEGATIVE NEGATIVE   Bilirubin Urine NEGATIVE NEGATIVE   Ketones, ur NEGATIVE NEGATIVE mg/dL   Protein, ur NEGATIVE NEGATIVE mg/dL   Nitrite NEGATIVE NEGATIVE   Leukocytes, UA NEGATIVE NEGATIVE   WBC, UA 0-5 0 - 5 WBC/hpf   RBC / HPF 0-5 0 - 5 RBC/hpf   Bacteria, UA RARE (A) NONE SEEN   Squamous Epithelial / LPF 0-5 (A) NONE SEEN    Comment: Performed at [facility]  Urine culture     Status: None (Preliminary result)   Collection Time: 07/04/15 11:15 AM  Result Value Ref Range   Specimen Description      URINE, CLEAN CATCH Performed at [facility]    Special Requests      Normal Performed at [facility]    Culture      NO GROWTH < 24 HOURS Performed at [facility]    Report Status PENDING   Lipid panel     Status: Abnormal   Collection Time: 07/05/15  6:35 AM  Result Value Ref Range   Cholesterol 173 0 - 200 mg/dL   Triglycerides 409 <811 mg/dL   HDL 46 >91 mg/dL   Total CHOL/HDL Ratio 3.8 RATIO   VLDL 21 0 - 40 mg/dL   LDL Cholesterol 478 (H) 0 - 99 mg/dL    Comment:        Total Cholesterol/HDL:CHD Risk Coronary Heart Disease Risk Table                     Men   Women  1/2 Average Risk   3.4   3.3  Average Risk       5.0   4.4  2 X Average Risk   9.6   7.1  3 X Average Risk  23.4   11.0        Use the calculated Patient Ratio above and the CHD Risk Table to determine the patient's CHD Risk.        ATP  III CLASSIFICATION (LDL):  <100     mg/dL   Optimal  295-621  mg/dL   Near or Above                    Optimal  130-159  mg/dL   Borderline  308-657  mg/dL   High  >846     mg/dL   Very High Performed at [facility]     Blood Alcohol level:  Lab Results  Component Value Date   [facility] <5 07/01/2015    Physical Findings: AIMS: Facial and Oral Movements Muscles of Facial Expression: None, normal Lips and Perioral Area: None, normal Jaw: None, normal Tongue: None, normal,Extremity Movements Upper (arms, wrists, hands, fingers): None, normal Lower (legs, knees, ankles, toes): None, normal, Trunk Movements Neck, shoulders, hips: None, normal, Overall Severity Severity of abnormal movements (highest score from questions above): None, normal Incapacitation due to abnormal movements: None, normal Patient's awareness of abnormal movements (rate only patient's report):  No Awareness, Dental Status Current problems with teeth and/or dentures?: No Does patient usually wear dentures?: No  CIWA:    COWS:     Musculoskeletal: Strength & Muscle Tone: within normal limits Gait & Station: normal Patient leans: N/A  Psychiatric Specialty Exam: Review of Systems  Psychiatric/Behavioral: The patient is nervous/anxious.   All other systems reviewed and are negative.   Blood pressure 123/71, pulse 120, temperature 98.7 F (37.1 C), temperature source Oral, resp. rate 20, height  (1.702 m), weight 93.441 kg (206 lb), unknown if currently breastfeeding.Body mass index is 32.26 kg/(m^2).  General Appearance: Disheveled  Eye Solicitor::  Fair  Speech:  Slow  Volume:  Decreased  Mood:  Anxious  Affect:  Congruent  Thought Process:  Disorganized  Orientation:  Full (Time, Place, and Person)  Thought Content:  Paranoid Ideation   Suicidal Thoughts:  No  Homicidal Thoughts:  No  Memory:  Immediate;   Fair Recent;   Fair Remote;   Fair  Judgement:  Impaired  Insight:  Shallow   Psychomotor Activity:  Decreased  Concentration:  Poor  Recall:  [employer] of Knowledge:Fair  Language: Fair  Akathisia:  No  Handed:  Right  AIMS (if indicated):     Assets:  Desire for Improvement Physical Health Social Support  ADL's:  Intact  Cognition: WNL  Sleep:  Number of Hours: 5.5   Treatment Plan Summary:Dorothy Pham is a 30 y.o. AA female, who is single , lives in [place] by self , has a 23 month old baby , who is currently with her mother , is unemployed , has a hx of schizoaffective do , who Presented to [facility] after being petitioned by her father for involuntary commitment for aggressive behavior at [place].Pt today continues to be observed as guarded, paranoid , irritable . Will continue treatment.  Daily contact with patient to assess and evaluate symptoms and progress in treatment and Medication management  Will increase Abilify to 30 mg po daily for mood sx/psychosis. Will offer Abilify Maintena IM 400 mg q 28 days - if she agrees. Will add PRN medications as per agitation protocol. Will continue Trazodone  150 mg po qhs for sleep. Patient refused the addition of a mood stabilizer. Will continue to monitor vitals ,medication compliance and treatment side effects while patient is here.  Will monitor for medical issues as well as call consult as needed.  Reviewed labs cbc - wnl, cmp - wnl, BAL<5, ,TSH - 01/30/15- WNL,lipid panel - wnl , ekg- qtc wnl, hba1c- pending.pregnancy test- negative. CSW will start working on disposition.  Patient to participate in therapeutic milieu .     Luan Urbani, MD 07/05/2015, 11:58 AM

## 2015-07-05 NOTE — Progress Notes (Signed)
D: Pt presents apprehensive and guarded. Pt observed as more interactive with her peers in the milieu. Pt denies any SI/HI/AVH. Pt refused her Cogentin, despite Clinical research associate verbalizing the indication of EPS prevention. " I took higher doses of Abilify". "I've been on Abilify since the age of 4".  A: Writer administered scheduled and prn medications to pt, per MD orders. Continued support and availability as needed was extended to this pt. Staff continues to monitor pt with q1min checks.  R: No adverse drug reactions noted. Pt receptive to treatment. Pt remains safe at this time.

## 2015-07-05 NOTE — [facility] Group Notes (Signed)
[facility] Group Notes:  (Counselor/Nursing/MHT/Case Management/Adjunct)  07/05/2015 1:15PM  Type of Therapy:  Group Therapy  Participation Level:  Active  Participation Quality:  Appropriate  Affect:  Flat  Cognitive:  Oriented  Insight:  Improving  Engagement in Group:  Limited  Engagement in Therapy:  Limited  Modes of Intervention:  Discussion, Exploration and Socialization  Summary of Progress/Problems: The topic for group was balance in life.  Pt participated in the discussion about when their life was in balance and out of balance and how this feels.  Pt discussed ways to get back in balance and short term goals they can work on to get where they want to be.  "You need to take control of your life.  No one else can do it for you, so you have to do it for yourself.  My life will be balanced again.  I have had some downfalls, and there are bound to be more.  Medication is helping me now.  I feel like I am on track."  Vague, but able to stay for the entire group and participate.  First time.  Good progress.   Daryel Gerald B 07/05/2015 1:42 PM

## 2015-07-05 NOTE — Progress Notes (Signed)
D: Pt presents appropriate in affect and mood . Some anxiety inferred. Pt actively participates in unit activities. Pt reports that she is able to sleep well with the use of Ativan. Pt refused her trazodone and cogentin. Refer to previous note by writer in regards to cogentin refusal. Pt denies any SI/HI/AVH. A: Writer administered scheduled and prn medications to pt, per MD orders. Continued support and availability as needed was extended to this pt. Staff continues to monitor pt with q7min checks.   R: No adverse drug reactions noted. Pt receptive to treatment. Pt remains safe at this time.

## 2015-07-05 NOTE — Progress Notes (Signed)
DAR NOTE: Patient presents with anxious affect and depressed mood.  Denies pain, auditory and visual hallucinations.  Rates depression at 0, hopelessness at 0, and anxiety at 0.  Maintained on routine safety checks.  Medications given as prescribed.  Support and encouragement offered as needed.  Attended group and participated.  States goal for today is "keep going to group and continue discharge."  Patient keeps to her self.  Minimal interaction with staff and peers.

## 2015-07-06 LAB — HEMOGLOBIN A1C
Hgb A1c MFr Bld: 5.6 % (ref 4.8–5.6)
Mean Plasma Glucose: 114 mg/dL

## 2015-07-06 NOTE — Progress Notes (Signed)
Recreation Therapy Notes  02.24.2017 approximately 8:45am. Per MD order LRT met with patient to investigate ways to enhance tx during admission. Patient reports she was admitted to get her medications stable. Patient reports intact support system at home, no stressors, coping skill of isolation and no need for additional services during admission. Patient repeated multiple times "everything is great and I just want to enjoy life, like just enjoy all of it." Patient made this statement with a exaggerated smile on her face and demonstrated celebratory gestures as well. LRT offered patient access to community resources such as information about the [employer], patient expressed interest, stating she likes to exercise. Patient additionally identified going to the beach and getting fresh air as leisure interest. Patient additionally shared she has a 9 month old at home, LRT attempted to investigate patient stress related to having a newborn, patient denied this has presented any stress for her as she has an excellent support system at home.   LRT to provide patient with sliding scale [employer] application.   LRT returned at approximately 11:30am to provide patient with sliding scale [employer] application. Patient receptive and accepted application without issue.   Laureen Ochs Grover Robinson, LRT/CTRS   Jacquie Lukes L 07/06/2015 9:09 AM

## 2015-07-06 NOTE — Progress Notes (Signed)
Patient has been up and active on the unit, attended group this evening and has been very anxious, guarded and worried about her medications. With much encourtagement she was compliant with her medications. Her mother and sister visited tonight and are very encouraging,Patient currently denies having pain, -si/hi/a/v hall. Support and encouragement offered, safety maintained on unit, will continue to monitor.

## 2015-07-06 NOTE — Progress Notes (Signed)
DAR Note: Dorothy Pham has been visible on the unit. She has been attending groups.  Minimal interaction with peers.  She denies SI/HI or A/V hallucinations.  She stated that she didn't want to go outside with the group because she doesn't have proper shoes and it would be uncomfortable.  She has been smiling more and is really focused on going home.  Reminded her to continue to follow the treatment plan in which she stated "that's the plan."  She completed her self inventory and reports that her depressions, hopelessness and anxiety are 0/10.  Her goal for today is "to continue taking medication, attending groups and routine."  She will accomplish this goal by "continue to attend groups and take medications to get better."  Encouraged continued participation in group and unit activities.  Q 15 minute checks maintained for safety.  We will continue to monitor the progress towards her goals.

## 2015-07-06 NOTE — [facility] Group Notes (Signed)
[facility] LCSW Aftercare Discharge Planning Group Note   07/06/2015 11:49 AM  Participation Quality: Active   Mood/Affect:  Appropriate  Depression Rating:  Denies   Anxiety Rating:  Denies   Thoughts of Suicide:  No Will you contract for safety?   NA  Current AVH:  No  Plan for Discharge/Comments:  Pt states that she is ready to leave and is doing everything she can to do so. "I'm going to do what I need to do, take my meds, attend groups, so that I can leave." Pt reports that she is sleeping well.  Transportation Means:   Supports:  Jonathon Jordan

## 2015-07-06 NOTE — Progress Notes (Signed)
Dorothy Pham has been on the phone much of the day.  Yelling and upset with her family.  She is begging to leave and wanting her family to come and get her.  Her mother came to visit today.  Dorothy Pham insisted on nurse talking with her and her mother together.  While talking Dorothy Pham continued to say she wanted to leave even after staff and mother explained to her that she needs to stay longer.  Mother doesn't feel that she is doing well and is concerned about her agitated behavior.  Encouraged her mother to talk with doctor to share her concerns.  Dorothy Pham made a statement to her mother that she is having problems with her vagina but will not explain to RN.  Her mother is going to try to talk with her and see if there is something that we need to address.  We will continue to monitor the progress towards her goals.

## 2015-07-06 NOTE — [facility] Group Notes (Signed)
[facility] LCSW Group Therapy   07/06/2015 1:48 PM  Type of Therapy: Group Therapy  Participation Level:  Active  Participation Quality:  Attentive  Affect:  Flat  Cognitive:  Oriented  Insight:  Limited  Engagement in Therapy:  Engaged  Modes of Intervention:  Discussion and Socialization  Summary of Progress/Problems: Chaplain was here to lead a group on themes of hope and/or courage.  Pt was present for the beginning of group. Left and did not return.  Dorothy Pham 07/06/2015 1:48 PM

## 2015-07-06 NOTE — Progress Notes (Signed)
[facility] MD Progress Note  07/06/2015 11:47 AM Dorothy Pham  MRN:  [id number] Subjective:  Pt states "I feel fine."   Objective; Dorothy Pham is a 30 y.o. AA female, who is single , lives in [place] by self , has a 67 month old baby , who is currently with her mother , is unemployed , has a hx of schizoaffective do , who Presented to [facility] after being petitioned by her father for involuntary commitment for aggressive behavior at [place].  Patient seen and chart reviewed.Discussed patient with treatment team.  Pt continues to appear paranoid , guarded, but is making progress. Pt continues to refuse the addition of another medication like a mood stabilizer.She reports she wants to stay on Abilify , does not want LAI .  Per staff - pt continues to be irritable, anxious often , is making use of PRN Ativan. Pt has been tolerating her medications well - will continues to encourage and support.    Principal Problem: Schizoaffective disorder, bipolar type (HCC) Diagnosis:   Patient Active Problem List   Diagnosis Date Noted  . Schizoaffective disorder, bipolar type (HCC) [F25.0] 07/03/2015  . Spontaneous vaginal delivery [O80] 04/16/2015  . Post term pregnancy, antepartum condition or complication [O48.0] 04/15/2015  . Murmur, cardiac [R01.1] 02/16/2015  . DOE (dyspnea on exertion) [R06.09] 02/16/2015  . Dysplasia of cervix, low grade (CIN 1) [N87.0] 04/21/2012  . Amniotic band syndrome--2013 pregnancy [O99.89] 09/24/2011  . Termination of pregnancy (fetus)--2013 due to anomalies [Z33.2] 09/24/2011  . Obesity [E66.9] 09/24/2011   Total Time spent with patient: 25 minutes  Past Psychiatric History: Pt with hx of schizoaffective disorder , follows up with [facility]. Pt reports she does not remember other medications that she had been tried on. Pt does report she was most recently on Abilify and that helps her, she had stopped taking it since she were pregnant. Pt denies past suicide attempts.   Past  Medical History:  Past Medical History  Diagnosis Date  . Mental disorder   . Schizoaffective disorder   . Complication of anesthesia   . PONV (postoperative nausea and vomiting)   . Schizoaffective disorder   . Heart murmur   . Obesity   . Vaginal Pap smear, abnormal   . Hx of varicella     Past Surgical History  Procedure Laterality Date  . Breast surgery  2012    reduction  . Colposcopy     Family History:  Family History  Problem Relation Age of Onset  . Anesthesia problems Neg Hx   . Diabetes Father   . Arthritis Father   . Arthritis Mother   . Bipolar disorder Mother   . Diabetes Maternal Grandmother   . Hypertension Maternal Grandmother   . Hypertension Maternal Grandfather   . Diabetes Maternal Grandfather   . Diabetes Paternal Grandmother   . Schizophrenia Paternal Grandmother    Family Psychiatric  History: mother has bipolar do, paternal grand father has schizophrenia. Pt denies alcoholism, drug abuse and suicide in her family Social History: Pt is single, lives by self in [place] , has a 18 month old baby , is unemployed , denies legal issues. History  Alcohol Use No     History  Drug Use No    Social History   Social History  . Marital Status: Single    Spouse Name: N/A  . Number of Children: N/A  . Years of Education: N/A   Social History Main Topics  . Smoking status: Never Smoker   .  Smokeless tobacco: Never Used  . Alcohol Use: No  . Drug Use: No  . Sexual Activity: No   Other Topics Concern  . None   Social History Narrative   Additional Social History:                         Sleep: Fair  Appetite:  Fair  Current Medications: Current Facility-Administered Medications  Medication Dose Route Frequency Provider Last Rate Last Dose  . acetaminophen (TYLENOL) tablet 650 mg  650 mg Oral Q6H PRN Charm Rings, NP      . alum & mag hydroxide-simeth (MAALOX/MYLANTA) 200-200-20 MG/5ML suspension 30 mL  30 mL Oral Q4H PRN Charm Rings, NP      . ARIPiprazole (ABILIFY) tablet 30 mg  30 mg Oral QHS Jomarie Longs, MD   25 mg at 07/05/15 2112  . benztropine (COGENTIN) tablet 1 mg  1 mg Oral QHS Charm Rings, NP   1 mg at 07/02/15 2236  . hydrOXYzine (ATARAX/VISTARIL) tablet 25 mg  25 mg Oral TID PRN Charm Rings, NP      . lisinopril (PRINIVIL,ZESTRIL) tablet 10 mg  10 mg Oral Daily Sanjuana Kava, NP      . lisinopril (PRINIVIL,ZESTRIL) tablet 20 mg  20 mg Oral Once Sanjuana Kava, NP   20 mg at 07/02/15 1516  . LORazepam (ATIVAN) tablet 1 mg  1 mg Oral Q6H PRN Jomarie Longs, MD   1 mg at 07/05/15 2112   Or  . LORazepam (ATIVAN) injection 1 mg  1 mg Intramuscular Q6H PRN Aayushi Solorzano, MD      . magnesium hydroxide (MILK OF MAGNESIA) suspension 30 mL  30 mL Oral Daily PRN Charm Rings, NP      . ondansetron [facility]) tablet 4 mg  4 mg Oral Q8H PRN Charm Rings, NP      . traZODone (DESYREL) tablet 150 mg  150 mg Oral QHS Jomarie Longs, MD   150 mg at 07/04/15 2200    Lab Results:  Results for orders placed or performed during the hospital encounter of 07/02/15 (from the past 48 hour(s))  Lipid panel     Status: Abnormal   Collection Time: 07/05/15  6:35 AM  Result Value Ref Range   Cholesterol 173 0 - 200 mg/dL   Triglycerides 161 <096 mg/dL   HDL 46 >04 mg/dL   Total CHOL/HDL Ratio 3.8 RATIO   VLDL 21 0 - 40 mg/dL   LDL Cholesterol 540 (H) 0 - 99 mg/dL    Comment:        Total Cholesterol/HDL:CHD Risk Coronary Heart Disease Risk Table                     Men   Women  1/2 Average Risk   3.4   3.3  Average Risk       5.0   4.4  2 X Average Risk   9.6   7.1  3 X Average Risk  23.4   11.0        Use the calculated Patient Ratio above and the CHD Risk Table to determine the patient's CHD Risk.        ATP III CLASSIFICATION (LDL):  <100     mg/dL   Optimal  981-191  mg/dL   Near or Above  Optimal  130-159  mg/dL   Borderline  161-096  mg/dL   High  >045     mg/dL   Very  High Performed at [facility]   Hemoglobin A1c     Status: None   Collection Time: 07/05/15  6:35 AM  Result Value Ref Range   Hgb A1c MFr Bld 5.6 4.8 - 5.6 %    Comment: (NOTE)         Pre-diabetes: 5.7 - 6.4         Diabetes: >6.4         Glycemic control for adults with diabetes: <7.0    Mean Plasma Glucose 114 mg/dL    Comment: (NOTE) Performed At: [facility] [address] [place], [place] [id number] Mila Homer MD NW:[id number] Performed at [facility]     Blood Alcohol level:  Lab Results  Component Value Date   [facility] <5 07/01/2015    Physical Findings: AIMS: Facial and Oral Movements Muscles of Facial Expression: None, normal Lips and Perioral Area: None, normal Jaw: None, normal Tongue: None, normal,Extremity Movements Upper (arms, wrists, hands, fingers): None, normal Lower (legs, knees, ankles, toes): None, normal, Trunk Movements Neck, shoulders, hips: None, normal, Overall Severity Severity of abnormal movements (highest score from questions above): None, normal Incapacitation due to abnormal movements: None, normal Patient's awareness of abnormal movements (rate only patient's report): No Awareness, Dental Status Current problems with teeth and/or dentures?: No Does patient usually wear dentures?: No  CIWA:    COWS:     Musculoskeletal: Strength & Muscle Tone: within normal limits Gait & Station: normal Patient leans: N/A  Psychiatric Specialty Exam: Review of Systems  Psychiatric/Behavioral: The patient is nervous/anxious.   All other systems reviewed and are negative.   Blood pressure 125/76, pulse 122, temperature 98.7 F (37.1 C), temperature source Oral, resp. rate 16, height 5\' 7"  (1.702 m), weight 93.441 kg (206 lb), unknown if currently breastfeeding.Body mass index is 32.26 kg/(m^2).  General Appearance: Casual and Guarded  Eye Contact::  Fair  Speech:  Slow  Volume:  Decreased  Mood:  Anxious   Affect:  Congruent  Thought Process:  Disorganized progressing  Orientation:  Full (Time, Place, and Person)  Thought Content:  Paranoid Ideation   Suicidal Thoughts:  No  Homicidal Thoughts:  No  Memory:  Immediate;   Fair Recent;   Fair Remote;   Fair  Judgement:  Impaired  Insight:  Shallow  Psychomotor Activity:  Decreased  Concentration:  Poor  Recall:  [employer] of Knowledge:Fair  Language: Fair  Akathisia:  No  Handed:  Right  AIMS (if indicated):     Assets:  Desire for Improvement Physical Health Social Support  ADL's:  Intact  Cognition: WNL  Sleep:  Number of Hours: 5   Treatment Plan Summary:Dorothy Pham is a 30 y.o. AA female, who is single , lives in [place] by self , has a 23 month old baby , who is currently with her mother , is unemployed , has a hx of schizoaffective do , who Presented to [facility] after being petitioned by her father for involuntary commitment for aggressive behavior at [place].Pt today continues to be observed as guarded, paranoid , irritable anxious , although progressing . Will continue treatment.  Daily contact with patient to assess and evaluate symptoms and progress in treatment and Medication management  Will increase Abilify to 30 mg po daily for mood sx/psychosis. Offered  Abilify Maintena IM 400  mg q 28 days - Pt refused. Will add PRN medications as per agitation protocol. Will continue Trazodone  150 mg po qhs for sleep. Patient refused the addition of a mood stabilizer. Will continue to monitor vitals ,medication compliance and treatment side effects while patient is here.  Will monitor for medical issues as well as call consult as needed.  Reviewed labs cbc - wnl, cmp - wnl, BAL<5, ,TSH - 01/30/15- WNL,lipid panel - wnl , ekg- qtc wnl, hba1c- 5.6 .pregnancy test- negative. CSW will start working on disposition.  Patient to participate in therapeutic milieu .     Dorothy Merrick, MD 07/06/2015, 11:47 AM

## 2015-07-07 NOTE — Progress Notes (Signed)
DAR NOTE: Patient presents with anxious mood and affect.  Denies pain, auditory and visual hallucinations.  Rates depression at 0, hopelessness at 0, and anxiety at 0.  Maintained on routine safety checks.  Medications given as prescribed.  Support and encouragement offered as needed.  Attended group and participated.  States goal for today is "keep going to group."  Patient observed socializing with peers in the dayroom.  patient continues to request for discharge and demanding to know what her treatment plan was after several explanation.

## 2015-07-07 NOTE — [facility] Group Notes (Signed)
[facility] LCSW Group Therapy  07/07/2015   11:00 AM   Type of Therapy:  Group Therapy  Participation Level:  Active  Participation Quality:  Appropriate and Attentive  Affect:  Calm   Cognitive:  Alert and Appropriate  Insight:  Developing/Improving and Engaged  Engagement in Therapy:  Developing/Improving and Engaged  Modes of Intervention:  Clarification, Confrontation, Discussion, Education, Exploration, Limit-setting, Orientation, Problem-solving, Rapport Building, Dance movement psychotherapist, Socialization and Support  Summary of Progress/Problems: Today's group topic was avoiding self sabotage and enabling behaviors. Group members were asked to define self sabotage and enabling and provide examples. Group members were then asked to discuss unhealthy relationships and how to have positive healthy boundaries with those that enable. Group members were asked to process how communicating needs and establishing a plan to change the above identified behavior.  Pt shared that she struggles with post partum and is doing much better now.  Pt discussed having great support, numerous times.  Pt was focused on treatment for post partum, interrupting peers to ask them and CSW about it.  Pt was tangential and hard to follow at times, but responded well to redirection.  Pt actively participated in group discussion.    Dorothy Ivan, LCSW 07/07/2015 1:04 PM

## 2015-07-07 NOTE — Progress Notes (Signed)
Adult Psychoeducational Group Note  Date:  07/07/2015 Time:  8:34 PM  Group Topic/Focus:  Wrap-Up Group:   The focus of this group is to help patients review their daily goal of treatment and discuss progress on daily workbooks.  Participation Level:  Active  Participation Quality:  Appropriate  Affect:  Appropriate  Cognitive:  Appropriate  Insight: Appropriate  Engagement in Group:  Engaged  Modes of Intervention:  Discussion  Additional Comment The patient some extremely confused.  Octavio Manns 07/07/2015, 8:34 PM

## 2015-07-07 NOTE — Progress Notes (Signed)
Patient ID: Dorothy Pham, female   DOB: May 30, 1985, 30 y.o.   MRN: [id number] [facility] MD Progress Note  07/07/2015 2:48 PM Dorothy Pham  MRN:  [id number] Subjective:  Pt states "I am releasing all the negative and going to be positive role model for my daughter. I have social support and I am going to lift myself up"  States mood is up and down and sleep is poor. Denies depression. Reports continued anxiety. Denies SI/HI/AVH. Denies SE to meds.    Objective; Dorothy Pham is a 30 y.o. AA female, who is single , lives in [place] by self , has a 60 month old baby , who is currently with her mother , is unemployed , has a hx of schizoaffective do , who Presented to [facility] after being petitioned by her father for involuntary commitment for aggressive behavior at [place].  Patient seen and chart reviewed.  Pt continues to appear paranoid , guarded and very focused on d/c but is making progress. Pt continues to refuse the addition of another medication like a mood stabilizer.She reports she wants to stay on Abilify , does not want LAI .  Per staff - pt continues to be irritable, minimally interactive, anxious often , is making use of PRN Ativan. Pt has been tolerating her medications well - will continues to encourage and support.    Principal Problem: Schizoaffective disorder, bipolar type (HCC) Diagnosis:   Patient Active Problem List   Diagnosis Date Noted  . Schizoaffective disorder, bipolar type (HCC) [F25.0] 07/03/2015  . Spontaneous vaginal delivery [O80] 04/16/2015  . Post term pregnancy, antepartum condition or complication [O48.0] 04/15/2015  . Murmur, cardiac [R01.1] 02/16/2015  . DOE (dyspnea on exertion) [R06.09] 02/16/2015  . Dysplasia of cervix, low grade (CIN 1) [N87.0] 04/21/2012  . Amniotic band syndrome--2013 pregnancy [O99.89] 09/24/2011  . Termination of pregnancy (fetus)--2013 due to anomalies [Z33.2] 09/24/2011  . Obesity [E66.9] 09/24/2011   Total Time spent with patient: 25  minutes  Past Psychiatric History: Pt with hx of schizoaffective disorder , follows up with [facility]. Pt reports she does not remember other medications that she had been tried on. Pt does report she was most recently on Abilify and that helps her, she had stopped taking it since she were pregnant. Pt denies past suicide attempts.   Past Medical History:  Past Medical History  Diagnosis Date  . Mental disorder   . Schizoaffective disorder   . Complication of anesthesia   . PONV (postoperative nausea and vomiting)   . Schizoaffective disorder   . Heart murmur   . Obesity   . Vaginal Pap smear, abnormal   . Hx of varicella     Past Surgical History  Procedure Laterality Date  . Breast surgery  2012    reduction  . Colposcopy     Family History:  Family History  Problem Relation Age of Onset  . Anesthesia problems Neg Hx   . Diabetes Father   . Arthritis Father   . Arthritis Mother   . Bipolar disorder Mother   . Diabetes Maternal Grandmother   . Hypertension Maternal Grandmother   . Hypertension Maternal Grandfather   . Diabetes Maternal Grandfather   . Diabetes Paternal Grandmother   . Schizophrenia Paternal Grandmother    Family Psychiatric  History: mother has bipolar do, paternal grand father has schizophrenia. Pt denies alcoholism, drug abuse and suicide in her family Social History: Pt is single, lives by self in [place] , has a 33 month old  baby , is unemployed , denies legal issues. History  Alcohol Use No     History  Drug Use No    Social History   Social History  . Marital Status: Single    Spouse Name: N/A  . Number of Children: N/A  . Years of Education: N/A   Social History Main Topics  . Smoking status: Never Smoker   . Smokeless tobacco: Never Used  . Alcohol Use: No  . Drug Use: No  . Sexual Activity: No   Other Topics Concern  . None   Social History Narrative   Additional Social History:                         Sleep:  Poor  Appetite:  Fair  Current Medications: Current Facility-Administered Medications  Medication Dose Route Frequency Provider Last Rate Last Dose  . acetaminophen (TYLENOL) tablet 650 mg  650 mg Oral Q6H PRN Charm Rings, NP      . alum & mag hydroxide-simeth (MAALOX/MYLANTA) 200-200-20 MG/5ML suspension 30 mL  30 mL Oral Q4H PRN Charm Rings, NP      . ARIPiprazole (ABILIFY) tablet 30 mg  30 mg Oral QHS Jomarie Longs, MD   30 mg at 07/06/15 2147  . benztropine (COGENTIN) tablet 1 mg  1 mg Oral QHS Charm Rings, NP   1 mg at 07/06/15 2147  . hydrOXYzine (ATARAX/VISTARIL) tablet 25 mg  25 mg Oral TID PRN Charm Rings, NP      . lisinopril (PRINIVIL,ZESTRIL) tablet 10 mg  10 mg Oral Daily Sanjuana Kava, NP      . lisinopril (PRINIVIL,ZESTRIL) tablet 20 mg  20 mg Oral Once Sanjuana Kava, NP   20 mg at 07/02/15 1516  . LORazepam (ATIVAN) tablet 1 mg  1 mg Oral Q6H PRN Jomarie Longs, MD   1 mg at 07/05/15 2112   Or  . LORazepam (ATIVAN) injection 1 mg  1 mg Intramuscular Q6H PRN Saramma Eappen, MD      . magnesium hydroxide (MILK OF MAGNESIA) suspension 30 mL  30 mL Oral Daily PRN Charm Rings, NP      . ondansetron [facility]) tablet 4 mg  4 mg Oral Q8H PRN Charm Rings, NP      . traZODone (DESYREL) tablet 150 mg  150 mg Oral QHS Jomarie Longs, MD   150 mg at 07/06/15 2147    Lab Results:  No results found for this or any previous visit (from the past 48 hour(s)).  Blood Alcohol level:  Lab Results  Component Value Date   ETH <5 07/01/2015    Physical Findings: AIMS: Facial and Oral Movements Muscles of Facial Expression: None, normal Lips and Perioral Area: None, normal Jaw: None, normal Tongue: None, normal,Extremity Movements Upper (arms, wrists, hands, fingers): None, normal Lower (legs, knees, ankles, toes): None, normal, Trunk Movements Neck, shoulders, hips: None, normal, Overall Severity Severity of abnormal movements (highest score from questions above):  None, normal Incapacitation due to abnormal movements: None, normal Patient's awareness of abnormal movements (rate only patient's report): No Awareness, Dental Status Current problems with teeth and/or dentures?: No Does patient usually wear dentures?: No  CIWA:    COWS:     Musculoskeletal: Strength & Muscle Tone: within normal limits Gait & Station: normal Patient leans: straight  Psychiatric Specialty Exam: Review of Systems  Respiratory: Negative for cough.   Gastrointestinal: Negative for abdominal pain.  Musculoskeletal: Negative for back pain, joint pain and neck pain.  Neurological: Negative for headaches.  Psychiatric/Behavioral: Negative for depression, suicidal ideas and hallucinations. The patient is nervous/anxious and has insomnia.   All other systems reviewed and are negative.   Blood pressure 125/76, pulse 122, temperature 98.7 F (37.1 C), temperature source Oral, resp. rate 16, height  (1.702 m), weight 93.441 kg (206 lb), unknown if currently breastfeeding.Body mass index is 32.26 kg/(m^2).  General Appearance: Casual and Guarded  Eye Contact::  Fair  Speech:  Slow  Volume:  Normal  Mood:  Anxious  Affect:  Labile  Thought Process:  Disorganized progressing  Orientation:  Full (Time, Place, and Person)  Thought Content:  Delusions and Paranoid Ideation   Suicidal Thoughts:  No  Homicidal Thoughts:  No  Memory:  Immediate;   Fair Recent;   Fair Remote;   Fair  Judgement:  Impaired  Insight:  Shallow  Psychomotor Activity:  Normal  Concentration:  Poor  Recall:  [employer] of Knowledge:Fair  Language: Fair  Akathisia:  No  Handed:  Right  AIMS (if indicated):     Assets:  Desire for Improvement Physical Health Social Support  ADL's:  Intact  Cognition: WNL  Sleep:  Number of Hours: 5   Treatment Plan Summary:Janasia Gaetz is a 30 y.o. AA female, who is single , lives in [place] by self , has a 36 month old baby , who is currently with her  mother , is unemployed , has a hx of schizoaffective do , who Presented to [facility] after being petitioned by her father for involuntary commitment for aggressive behavior at [place].Pt today continues to be observed as guarded, paranoid , irritable anxious , although progressing . Will continue treatment.  Daily contact with patient to assess and evaluate symptoms and progress in treatment and Medication management  Abilify to 30 mg po daily for mood sx/psychosis. Offered  Abilify Maintena IM 400 mg q 28 days - Pt refused. Will add PRN medications as per agitation protocol. Will continue Trazodone  150 mg po qhs for sleep. Patient refused the addition of a mood stabilizer. Will continue to monitor vitals ,medication compliance and treatment side effects while patient is here.  Will monitor for medical issues as well as call consult as needed.  Reviewed labs cbc - wnl, cmp - wnl, BAL<5, ,TSH - 01/30/15- WNL,lipid panel - wnl , ekg- qtc wnl, hba1c- 5.6 .pregnancy test- negative. CSW will start working on disposition.  Patient to participate in therapeutic milieu .     Oletta Darter, MD 07/07/2015, 2:48 PM

## 2015-07-08 MED ORDER — LITHIUM CARBONATE ER 300 MG PO TBCR
300.0000 mg | EXTENDED_RELEASE_TABLET | Freq: Two times a day (BID) | ORAL | Status: DC
  Filled 2015-07-08 (×7): qty 1

## 2015-07-08 NOTE — Progress Notes (Signed)
D: Patient alert and oriented x 4. Patient denies pain/SI/HI/AVH. Patient pacing the unit, and coming to nurses station multiple times during evening asking about discharge and what she needs to do. This Clinical research associate advised patient to try and get better. Patient would chant to self, "I will get strong, I will continue to get better."  A: Staff to monitor Q 15 mins for safety. Encouragement and support offered. Scheduled medications administered per orders. R: Patient remains safe on the unit. Patient attended group tonight. Patient visible on the unit and interacting with peers. Patient taking administered medications.\

## 2015-07-08 NOTE — Progress Notes (Signed)
Writer spoke with patient 1:1 concerning her 2000 lithium which she refused, she reports that her mother does not want her taking it and she feels that she is ready to get out of here. She appears angry and also inquires about her night meds with which she reports that she does not want to take her cogentin b/c she doesn't like the way it makes her feel. She denies si/hi/a/v hallucinations. Support given and safety maintained on unit with 15 min checks.

## 2015-07-08 NOTE — Progress Notes (Signed)
[facility] MD Progress Note  07/08/2015 11:57 AM Dorothy Pham  MRN:  [id number] Subjective:  Pt states "I am trying to get it all out. I can do it". Reports she is anxious and overwhelmed but not as much as when she was admitted.  She has poor concentration and is easily confused but it is better than before. States mood is up and down she has some depression. Denies SI/HI/AH. States she has VH. States meds make her dizzy sometimes.  Reports she was taking Abilify 30mg  prior to pregnancy and it helped a lot.     Objective; Dorothy Pham is a 30 y.o. AA female, who is single , lives in [place] by self , has a 71 month old baby , who is currently with her mother , is unemployed , has a hx of schizoaffective do , who Presented to [facility] after being petitioned by her father for involuntary commitment for aggressive behavior at [place].  Patient seen and chart reviewed.  Pt continues to appear paranoid , guarded and very focused on d/c but is making progress. She reports she wants to stay on Abilify, does not want LAI .  Per staff - pt is intrusive and is attending groups.  Pt has been tolerating her medications well - will continues to encourage and support.She has agreed to go up on the dose of Abilify.    Principal Problem: Schizoaffective disorder, bipolar type (HCC) Diagnosis:   Patient Active Problem List   Diagnosis Date Noted  . Schizoaffective disorder, bipolar type (HCC) [F25.0] 07/03/2015  . Spontaneous vaginal delivery [O80] 04/16/2015  . Post term pregnancy, antepartum condition or complication [O48.0] 04/15/2015  . Murmur, cardiac [R01.1] 02/16/2015  . DOE (dyspnea on exertion) [R06.09] 02/16/2015  . Dysplasia of cervix, low grade (CIN 1) [N87.0] 04/21/2012  . Amniotic band syndrome--2013 pregnancy [O99.89] 09/24/2011  . Termination of pregnancy (fetus)--2013 due to anomalies [Z33.2] 09/24/2011  . Obesity [E66.9] 09/24/2011   Total Time spent with patient: 25 minutes  Past Psychiatric  History: Pt with hx of schizoaffective disorder , follows up with [facility]. Pt reports she does not remember other medications that she had been tried on. Pt does report she was most recently on Abilify and that helps her, she had stopped taking it since she were pregnant. Pt denies past suicide attempts.   Past Medical History:  Past Medical History  Diagnosis Date  . Mental disorder   . Schizoaffective disorder   . Complication of anesthesia   . PONV (postoperative nausea and vomiting)   . Schizoaffective disorder   . Heart murmur   . Obesity   . Vaginal Pap smear, abnormal   . Hx of varicella     Past Surgical History  Procedure Laterality Date  . Breast surgery  2012    reduction  . Colposcopy     Family History:  Family History  Problem Relation Age of Onset  . Anesthesia problems Neg Hx   . Diabetes Father   . Arthritis Father   . Arthritis Mother   . Bipolar disorder Mother   . Diabetes Maternal Grandmother   . Hypertension Maternal Grandmother   . Hypertension Maternal Grandfather   . Diabetes Maternal Grandfather   . Diabetes Paternal Grandmother   . Schizophrenia Paternal Grandmother    Family Psychiatric  History: mother has bipolar do, paternal grand father has schizophrenia. Pt denies alcoholism, drug abuse and suicide in her family Social History: Pt is single, lives by self in [place] , has  a 42 month old baby , is unemployed , denies legal issues. History  Alcohol Use No     History  Drug Use No    Social History   Social History  . Marital Status: Single    Spouse Name: N/A  . Number of Children: N/A  . Years of Education: N/A   Social History Main Topics  . Smoking status: Never Smoker   . Smokeless tobacco: Never Used  . Alcohol Use: No  . Drug Use: No  . Sexual Activity: No   Other Topics Concern  . None   Social History Narrative   Additional Social History:                         Sleep: Fair  Appetite:  Fair  Current  Medications: Current Facility-Administered Medications  Medication Dose Route Frequency Provider Last Rate Last Dose  . acetaminophen (TYLENOL) tablet 650 mg  650 mg Oral Q6H PRN Charm Rings, NP      . alum & mag hydroxide-simeth (MAALOX/MYLANTA) 200-200-20 MG/5ML suspension 30 mL  30 mL Oral Q4H PRN Charm Rings, NP      . ARIPiprazole (ABILIFY) tablet 30 mg  30 mg Oral QHS Jomarie Longs, MD   30 mg at 07/07/15 2112  . benztropine (COGENTIN) tablet 1 mg  1 mg Oral QHS Charm Rings, NP   1 mg at 07/07/15 2112  . hydrOXYzine (ATARAX/VISTARIL) tablet 25 mg  25 mg Oral TID PRN Charm Rings, NP   25 mg at 07/08/15 0748  . lisinopril (PRINIVIL,ZESTRIL) tablet 10 mg  10 mg Oral Daily Sanjuana Kava, NP   10 mg at 07/08/15 0748  . lisinopril (PRINIVIL,ZESTRIL) tablet 20 mg  20 mg Oral Once Sanjuana Kava, NP   20 mg at 07/02/15 1516  . LORazepam (ATIVAN) tablet 1 mg  1 mg Oral Q6H PRN Jomarie Longs, MD   1 mg at 07/08/15 0748   Or  . LORazepam (ATIVAN) injection 1 mg  1 mg Intramuscular Q6H PRN Saramma Eappen, MD      . magnesium hydroxide (MILK OF MAGNESIA) suspension 30 mL  30 mL Oral Daily PRN Charm Rings, NP      . ondansetron [facility]) tablet 4 mg  4 mg Oral Q8H PRN Charm Rings, NP      . traZODone (DESYREL) tablet 150 mg  150 mg Oral QHS Jomarie Longs, MD   150 mg at 07/07/15 2112    Lab Results:  No results found for this or any previous visit (from the past 48 hour(s)).  Blood Alcohol level:  Lab Results  Component Value Date   ETH <5 07/01/2015    Physical Findings: AIMS: Facial and Oral Movements Muscles of Facial Expression: None, normal Lips and Perioral Area: None, normal Jaw: None, normal Tongue: None, normal,Extremity Movements Upper (arms, wrists, hands, fingers): None, normal Lower (legs, knees, ankles, toes): None, normal, Trunk Movements Neck, shoulders, hips: None, normal, Overall Severity Severity of abnormal movements (highest score from questions  above): None, normal Incapacitation due to abnormal movements: None, normal Patient's awareness of abnormal movements (rate only patient's report): No Awareness, Dental Status Current problems with teeth and/or dentures?: No Does patient usually wear dentures?: No  CIWA:    COWS:     Musculoskeletal: Strength & Muscle Tone: within normal limits Gait & Station: normal Patient leans: straight  Psychiatric Specialty Exam: Review of Systems  Respiratory: Negative  for cough.   Gastrointestinal: Negative for abdominal pain.  Musculoskeletal: Negative for back pain, joint pain and neck pain.  Neurological: Positive for dizziness. Negative for headaches.  Psychiatric/Behavioral: Positive for depression and hallucinations. Negative for suicidal ideas. The patient is nervous/anxious and has insomnia.   All other systems reviewed and are negative.   Blood pressure 117/85, pulse 139, temperature 98.7 F (37.1 C), temperature source Oral, resp. rate 20, height  (1.702 m), weight 93.441 kg (206 lb), unknown if currently breastfeeding.Body mass index is 32.26 kg/(m^2).  General Appearance: Casual and Guarded  Eye Contact::  Fair  Speech:  Clear and Coherent and Normal Rate- improved  Volume:  Normal  Mood:  Anxious  Affect:  Labile  Thought Process:  Disorganized progressing  Orientation:  Full (Time, Place, and Person)  Thought Content:  Delusions and Paranoid Ideation - improving  Suicidal Thoughts:  No  Homicidal Thoughts:  No  Memory:  Immediate;   Fair Recent;   Fair Remote;   Fair  Judgement:  Impaired  Insight:  Shallow  Psychomotor Activity:  Normal  Concentration:  Poor- improving  Recall:  [employer] of Knowledge:Fair  Language: Fair  Akathisia:  No  Handed:  Right  AIMS (if indicated):     Assets:  Desire for Improvement Physical Health Social Support  ADL's:  Intact  Cognition: WNL  Sleep:  Number of Hours: 6   Treatment Plan Summary:Dorothy Pham is a 30 y.o.  AA female, who is single , lives in [place] by self , has a 45 month old baby , who is currently with her mother , is unemployed , has a hx of schizoaffective do , who Presented to [facility] after being petitioned by her father for involuntary commitment for aggressive behavior at [place].Pt today continues to be observed as guarded, paranoid , irritable anxious , although progressing . Will continue treatment.  Daily contact with patient to assess and evaluate symptoms and progress in treatment and Medication management  Abilify to 30 mg po daily for mood sx/psychosis. Offered  Abilify Maintena IM 400 mg q 28 days - Pt refused. Will add PRN medications as per agitation protocol. Will continue Trazodone  150 mg po qhs for sleep. Patient agreed to take Lithobid  po BID for mood stabalization Will continue to monitor vitals ,medication compliance and treatment side effects while patient is here.  Will monitor for medical issues as well as call consult as needed.  Reviewed labs cbc - wnl, cmp - wnl, BAL<5, ,TSH - 01/30/15- WNL,lipid panel - wnl , ekg- qtc wnl, hba1c- 5.6 .pregnancy test- negative. CSW will start working on disposition.  Patient to participate in therapeutic milieu .     Oletta Darter, MD 07/08/2015, 11:57 AM

## 2015-07-08 NOTE — [facility] Group Notes (Signed)
[facility] Group Notes:  (Nursing/MHT/Case Management/Adjunct)  Date:  07/08/2015  Time:  12:33 PM  Type of Therapy:  Psychoeducational Skills  Participation Level:  Active  Participation Quality:  Intrusive and Monopolizing  Affect:  Anxious  Cognitive:  Disorganized  Insight:  Lacking  Engagement in Group:  Monopolizing  Modes of Intervention:  Problem-solving  Summary of Progress/Problems: Pt attended patient self inventory group. Pt reported that her depression was a 10, her hopelessness was a 6, and her anxiety was a 10. Pt reported that her support system was her mother and sister.     Jacquelyne Balint Shanta 07/08/2015, 12:33 PM

## 2015-07-08 NOTE — [facility] Group Notes (Signed)
[facility] LCSW Group Therapy  07/08/2015   11:00 AM   Type of Therapy:  Group Therapy  Participation Level:  Active  Participation Quality:  Appropriate and Attentive  Affect:  Flat  Cognitive:  Alert and Appropriate  Insight:  Developing/Improving and Engaged  Engagement in Therapy:  Developing/Improving and Engaged  Modes of Intervention:  Clarification, Confrontation, Discussion, Education, Exploration, Limit-setting, Orientation, Problem-solving, Rapport Building, Dance movement psychotherapist, Socialization and Support  Summary of Progress/Problems: The main focus of today's process group was to identify the patient's current support system and decide on other supports that can be put in place.  An emphasis was placed on using counselor, doctor, therapy groups, 12-step groups, and problem-specific support groups to expand supports, as well as doing something different than has been done before. Pt shared that her family is very supportive and help her with her baby.  Pt had to be redirected from sharing the milestones and daily activities of her baby; pt responded well to redirection.  Pt actively participated and was engaged in group discussion.    Reyes Ivan, LCSW 07/08/2015 2:23 PM

## 2015-07-08 NOTE — Progress Notes (Signed)
Patient ID: Dorothy Pham, female   DOB: [birth year], 30 y.o.   MRN: [id number]   D: Pt has been very anxious and obsessive today on the unit today. Pt has required frequent redirection, due to anxiety and obsessive behaviors on the unit. Pt reported that her energy was low and that her medication was not working. This Clinical research associate informed doctor of patients concerns and the request to be started on mediation for depression. Pt was seen by doctor and started on Lithobid, however patient refused dose and reported that her mom did not want her on. Pt reported that she felt like staff were giving her extra medication, this Clinical research associate gave patient a list of her medications. Pt reported that he depression was a 10, her hopelessness was a 5, and her anxiety was a 10. Pt reported that her goal for today wad to keep trying. Pt reported being negative SI/HI, no AH/VH noted. A: 15 min checks continued for patient safety. R: Pt safety maintained.

## 2015-07-09 NOTE — [facility] Group Notes (Signed)
[facility] LCSW Group Therapy  07/09/2015 4:16 PM  Type of Therapy:  Group Therapy  Participation Level:  Attentive but limited  Participation Quality:  Appropriate and Attentive  Affect:  Appropriate  Cognitive:  Alert and Appropriate  Insight:  Developing/Improving  Engagement in Therapy:  Developing/Improving  Modes of Intervention:  Discussion, Exploration, Problem-solving and Support  Summary of Progress/Problems:  Finding Balance in Life. Today's group focused on defining balance in one's own words, identifying things that can knock one off balance, and exploring healthy ways to maintain balance in life. Group members were asked to provide an example of a time when they felt off balance, describe how they handled that situation, and process healthier ways to regain balance in the future. Group members were asked to share the most important tool for maintaining balance that they learned while at [facility] and how they plan to apply this method after discharge.   Patient was quietly attentive in group, pleasant when asked to respond but did not volunteer.  Described balance as "keeping on track, managing and being in control, keeping my mind on my work."  States that "planning my day" is helpful in maintaining balance.   Sallee Lange

## 2015-07-09 NOTE — [facility] Group Notes (Signed)
[facility] LCSW Aftercare Discharge Planning Group Note  07/09/2015 8:45 AM  Participation Quality: Alert, Appropriate and Oriented  Mood/Affect: Appropriate  Depression Rating: 2-3  Anxiety Rating: 2-3  Thoughts of Suicide: Pt denies SI/HI  Will you contract for safety? Yes  Current AVH: Pt denies  Plan for Discharge/Comments: Pt attended discharge planning group and actively participated in group. CSW discussed suicide prevention education with the group and encouraged them to discuss discharge planning and any relevant barriers. Pt appears to be somewhat guarded, answering questions vaguely.   Transportation Means: Pt reports access to transportation  Supports: No supports mentioned at this time  Chad Cordial, LCSWA 07/09/2015 1:16 PM

## 2015-07-09 NOTE — Progress Notes (Signed)
D: Pt presented guarded, paranoid, and apprehensive to care. Anxiety inferred.  Pt refused her scheduled cogentin and lithium.  Pt only took half of her trazodone dose for sleep. Pt inquired about a prn med for anxiety. Pt was informed of her options for mild to severe anxiety. Pt declined. Per pt, she is refusing such medications due to her mom's advice. Pt was encouraged to speak with the psychiatrist in regards to her refusal of multiple medications. Pt had no further concerns she wished for this writer to address at this time. Pt is denied any SI/HI/AVH.  A: Writer administered scheduled medications to pt, per MD orders. Continued support and availability as needed was extended to this pt. Staff continues to monitor pt with q9min checks.  R: No adverse drug reactions noted. Progress needed in regards to tx compliance. Pt remains safe at this time.

## 2015-07-09 NOTE — Progress Notes (Addendum)
D:  Patient's self inventory sheet, patient sleeps good, no sleep medication given.  Good appetite, normal energy level, good concentration.  Denied depression, anxiety and hopeless.  No withdrawals.  Denied SI.  Denied physical problems.  Denied pain.  Goal is to keep going to groups and feeling better. A:  Patient refused morning medications, lisinopril 10 mg and lithium 300 mg.  Patient was asked several times by nurse and patient continued to refuse meds, stating "My mom told me not to take morning medications.  I take my medicines at night."  R:  Safety maintained with 15 minute checks.  Emotional support and encouragement given patient. Patient denied SI and HI, contracts for safety.  Denied A/V hallucinations.

## 2015-07-09 NOTE — Progress Notes (Signed)
Patient ID: Dorothy Pham, female   DOB: Aug 11, 1985, 30 y.o.   MRN: [id number] [facility] MD Progress Note  07/09/2015 11:34 AM Dorothy Pham  MRN:  [id number] Subjective:  Pt states "I am fine , I am taking the Abilify.'      Objective; Dorothy Pham is a 30 y.o. AA female, who is single , lives in [place] by self , has a 45 month old baby , who is currently with her mother , is unemployed , has a hx of schizoaffective do , who Presented to [facility] after being petitioned by her father for involuntary commitment for aggressive behavior at [place].  Patient seen and chart reviewed.  Pt today is seen as guarded , but is less paranoid than previous days. Pt denies new concerns , however as per staff pt continues to have periods when she is having mood lability /and requires redirections on the unit.Pt continues to need PRN medications for agitation on the unit. Pt was started on Lithium over the weekend , but continues to refuse it. Provided medication education ,will continue to encourage.  Principal Problem: Schizoaffective disorder, bipolar type (HCC) Diagnosis:   Patient Active Problem List   Diagnosis Date Noted  . Schizoaffective disorder, bipolar type (HCC) [F25.0] 07/03/2015  . Spontaneous vaginal delivery [O80] 04/16/2015  . Post term pregnancy, antepartum condition or complication [O48.0] 04/15/2015  . Murmur, cardiac [R01.1] 02/16/2015  . DOE (dyspnea on exertion) [R06.09] 02/16/2015  . Dysplasia of cervix, low grade (CIN 1) [N87.0] 04/21/2012  . Amniotic band syndrome--2013 pregnancy [O99.89] 09/24/2011  . Termination of pregnancy (fetus)--2013 due to anomalies [Z33.2] 09/24/2011  . Obesity [E66.9] 09/24/2011   Total Time spent with patient: 25 minutes  Past Psychiatric History: Pt with hx of schizoaffective disorder , follows up with [facility]. Pt reports she does not remember other medications that she had been tried on. Pt does report she was most recently on Abilify and that helps her,  she had stopped taking it since she were pregnant. Pt denies past suicide attempts.   Past Medical History:  Past Medical History  Diagnosis Date  . Mental disorder   . Schizoaffective disorder   . Complication of anesthesia   . PONV (postoperative nausea and vomiting)   . Schizoaffective disorder   . Heart murmur   . Obesity   . Vaginal Pap smear, abnormal   . Hx of varicella     Past Surgical History  Procedure Laterality Date  . Breast surgery  2012    reduction  . Colposcopy     Family History:  Family History  Problem Relation Age of Onset  . Anesthesia problems Neg Hx   . Diabetes Father   . Arthritis Father   . Arthritis Mother   . Bipolar disorder Mother   . Diabetes Maternal Grandmother   . Hypertension Maternal Grandmother   . Hypertension Maternal Grandfather   . Diabetes Maternal Grandfather   . Diabetes Paternal Grandmother   . Schizophrenia Paternal Grandmother    Family Psychiatric  History: mother has bipolar do, paternal grand father has schizophrenia. Pt denies alcoholism, drug abuse and suicide in her family Social History: Pt is single, lives by self in [place] , has a 38 month old baby , is unemployed , denies legal issues. History  Alcohol Use No     History  Drug Use No    Social History   Social History  . Marital Status: Single    Spouse Name: N/A  . Number of  Children: N/A  . Years of Education: N/A   Social History Main Topics  . Smoking status: Never Smoker   . Smokeless tobacco: Never Used  . Alcohol Use: No  . Drug Use: No  . Sexual Activity: No   Other Topics Concern  . None   Social History Narrative   Additional Social History:                         Sleep: Fair  Appetite:  Fair  Current Medications: Current Facility-Administered Medications  Medication Dose Route Frequency Provider Last Rate Last Dose  . acetaminophen (TYLENOL) tablet 650 mg  650 mg Oral Q6H PRN Charm Rings, NP      . alum & mag  hydroxide-simeth (MAALOX/MYLANTA) 200-200-20 MG/5ML suspension 30 mL  30 mL Oral Q4H PRN Charm Rings, NP      . ARIPiprazole (ABILIFY) tablet 30 mg  30 mg Oral QHS Jomarie Longs, MD   30 mg at 07/08/15 2111  . benztropine (COGENTIN) tablet 1 mg  1 mg Oral QHS Charm Rings, NP   1 mg at 07/07/15 2112  . hydrOXYzine (ATARAX/VISTARIL) tablet 25 mg  25 mg Oral TID PRN Charm Rings, NP   25 mg at 07/08/15 0748  . lisinopril (PRINIVIL,ZESTRIL) tablet 10 mg  10 mg Oral Daily Sanjuana Kava, NP   10 mg at 07/08/15 0748  . lisinopril (PRINIVIL,ZESTRIL) tablet 20 mg  20 mg Oral Once Sanjuana Kava, NP   20 mg at 07/02/15 1516  . lithium carbonate (LITHOBID) CR tablet 300 mg  300 mg Oral Q12H Oletta Darter, MD   300 mg at 07/08/15 1215  . LORazepam (ATIVAN) tablet 1 mg  1 mg Oral Q6H PRN Jomarie Longs, MD   1 mg at 07/08/15 0748   Or  . LORazepam (ATIVAN) injection 1 mg  1 mg Intramuscular Q6H PRN Aleysha Meckler, MD      . magnesium hydroxide (MILK OF MAGNESIA) suspension 30 mL  30 mL Oral Daily PRN Charm Rings, NP      . ondansetron [facility]) tablet 4 mg  4 mg Oral Q8H PRN Charm Rings, NP      . traZODone (DESYREL) tablet 150 mg  150 mg Oral QHS Jomarie Longs, MD   150 mg at 07/07/15 2112    Lab Results:  No results found for this or any previous visit (from the past 48 hour(s)).  Blood Alcohol level:  Lab Results  Component Value Date   ETH <5 07/01/2015    Physical Findings: AIMS: Facial and Oral Movements Muscles of Facial Expression: None, normal Lips and Perioral Area: None, normal Jaw: None, normal Tongue: None, normal,Extremity Movements Upper (arms, wrists, hands, fingers): None, normal Lower (legs, knees, ankles, toes): None, normal, Trunk Movements Neck, shoulders, hips: None, normal, Overall Severity Severity of abnormal movements (highest score from questions above): None, normal Incapacitation due to abnormal movements: None, normal Patient's awareness of abnormal  movements (rate only patient's report): No Awareness, Dental Status Current problems with teeth and/or dentures?: No Does patient usually wear dentures?: No  CIWA:  CIWA-Ar Total: 1 COWS:  COWS Total Score: 2  Musculoskeletal: Strength & Muscle Tone: within normal limits Gait & Station: normal Patient leans: straight  Psychiatric Specialty Exam: Review of Systems  Psychiatric/Behavioral: The patient is nervous/anxious.   All other systems reviewed and are negative.   Blood pressure 120/64, pulse 99, temperature 98 F (36.7  C), temperature source Oral, resp. rate 20, height  (1.702 m), weight 93.441 kg (206 lb), unknown if currently breastfeeding.Body mass index is 32.26 kg/(m^2).  General Appearance: Casual and Guarded  Eye Contact::  Fair  Speech:  Slow  Volume:  Normal  Mood:  Anxious  Affect:  Labile  Thought Process:  Disorganized progressing  Orientation:  Full (Time, Place, and Person)  Thought Content:  Delusions and Paranoid Ideation   Suicidal Thoughts:  No  Homicidal Thoughts:  No  Memory:  Immediate;   Fair Recent;   Fair Remote;   Fair  Judgement:  Impaired  Insight:  Shallow  Psychomotor Activity:  Normal  Concentration:  Poor  Recall:  [employer] of Knowledge:Fair  Language: Fair  Akathisia:  No  Handed:  Right  AIMS (if indicated):     Assets:  Desire for Improvement Physical Health Social Support  ADL's:  Intact  Cognition: WNL  Sleep:  Number of Hours: 4.25   Treatment Plan Summary:Dorothy Pham is a 30 y.o. AA female, who is single , lives in [place] by self , has a 54 month old baby , who is currently with her mother , is unemployed , has a hx of schizoaffective do , who Presented to [facility] after being petitioned by her father for involuntary commitment for aggressive behavior at [place].Pt today continues to be observed as guarded, paranoid , irritable anxious , although progressing . Will continue treatment.  Daily contact with patient to assess  and evaluate symptoms and progress in treatment and Medication management  Abilify 30 mg po daily for mood sx/psychosis. Offered  Abilify Maintena IM 400 mg q 28 days - Pt refused. Will continue Lithium 300 mg po bid for mood sx . Li level in 5 days.Pt although continues to refuse. Provided medication education. Will add PRN medications as per agitation protocol. Will continue Trazodone  150 mg po qhs for sleep. Will continue to monitor vitals ,medication compliance and treatment side effects while patient is here.  Will monitor for medical issues as well as call consult as needed.  Reviewed labs cbc - wnl, cmp - wnl, BAL<5, ,TSH - 01/30/15- WNL,lipid panel - wnl , ekg- qtc wnl, hba1c- 5.6 .pregnancy test- negative. CSW will start working on disposition.  Patient to participate in therapeutic milieu .     Sohan Potvin, MD 07/09/2015, 11:34 AM

## 2015-07-09 NOTE — Progress Notes (Signed)
Did not attend wrap up group

## 2015-07-09 NOTE — Progress Notes (Signed)
Patient has refused morning medications.  Patient stated her mother does not want her to take morning medications.  Nurse asked patient several different times to take her morning medications.

## 2015-07-09 NOTE — Plan of Care (Signed)
Problem: Consults Goal: Depression Patient Education See Patient Education Module for education specifics.  Outcome: Progressing Nurse discussed depression/coping skills with patient.        

## 2015-07-09 NOTE — Tx Team (Signed)
Interdisciplinary Treatment Plan Update (Adult)  Date:  07/09/2015   Time Reviewed:  1:22 PM   Progress in Treatment: Attending groups: Yes. Participating in groups:  Yes. Taking medication as prescribed:  Yes. Tolerating medication:  Yes. Family/Significant other contact made:  Yes, with mother Patient understands diagnosis:  No  Limited insight Discussing patient identified problems/goals with staff:  Yes, see initial care plan. Medical problems stabilized or resolved:  Yes. Denies suicidal/homicidal ideation: Yes. Issues/concerns per patient self-inventory:  No. Other:  New problem(s) identified:  Discharge Plan or Barriers: see below  Reason for Continuation of Hospitalization: Hallucinations Medication stabilization Other; describe Paranoia, Disorganization  Comments: Dorothy Pham is a 30 y.o. AA female, who is single , lives in [place] by self , has a 2 month old baby , who is currently with her mother , is unemployed , has a hx of schizoaffective do , who Presented to [facility] after being petitioned by her father for involuntary commitment for aggressive behavior at [place].Pt today continues to be observed as guarded, paranoid as well as having sleep issues. Will continue treatment.  Daily contact with patient to assess and evaluate symptoms and progress in treatment and Medication management  Will increase Abilify to 25 mg po daily for mood sx/psychosis. Will offer Abilify Maintena IM 400 mg q 28 days - if she agrees. Will add PRN medications as per agitation protocol. Will increase Trazodone to 150 mg po qhs for sleep. No medication changed as of meeting on 2/27.  Estimated length of stay: 1-2 days  New goal(s):  Review of initial/current patient goals per problem list:   Review of initial/current patient goals per problem list:  1. Goal(s): Patient will participate in aftercare plan   Met: Yes   Target date: 3-5 days post admission date   As evidenced by:  Patient will participate within aftercare plan AEB aftercare provider and housing plan at discharge being identified. 07/04/15:  Plans to return home, follow up outpt   5. Goal(s): Patient will demonstrate decreased signs of psychosis  * Met: Progressing  * Target date: 3-5 days post admission date  * As evidenced by: Patient will demonstrate decreased frequency of AVH or return to baseline function 07/04/15:  Pt was not taking meds for the past year.  Presents with symptoms of paranoia, disorganization, VH. 07/09/15: Pt continues to be somewhat guarded, but denies AVH at this time. Pt continues to be intrusive at times but is redirectable.      Attendees: Patient:  07/09/2015 1:22 PM   Family:   07/09/2015 1:22 PM   Physician:  Ursula Alert, MD 07/09/2015 1:22 PM   Nursing:   Manuella Ghazi, RN; Grayland Ormond, RN 07/09/2015 1:22 PM   CSW:    Peri Maris, LCSW   07/09/2015 1:22 PM   Other:  07/09/2015 1:22 PM   Other:   07/09/2015 1:22 PM   Other:  Lars Pinks, Nurse CM 07/09/2015 1:22 PM   Other:   07/09/2015 1:22 PM   Other:  Norberto Sorenson, [place]  07/09/2015 1:22 PM   Other:  07/09/2015 1:22 PM   Other:  07/09/2015 1:22 PM   Other:  07/09/2015 1:22 PM   Other:  07/09/2015 1:22 PM   Other:  07/09/2015 1:22 PM   Other:   07/09/2015 1:22 PM    Scribe for Treatment Team:   Peri Maris, [place] [employer] [phone]

## 2015-07-10 MED ORDER — HYDROXYZINE HCL 25 MG PO TABS: 25.0000 mg | ORAL_TABLET | Freq: Three times a day (TID) | ORAL | Status: AC | PRN

## 2015-07-10 MED ORDER — TRAZODONE HCL 150 MG PO TABS: 150.0000 mg | ORAL_TABLET | Freq: Every day | ORAL | Status: AC

## 2015-07-10 MED ORDER — LAMOTRIGINE 25 MG PO TABS
25.0000 mg | ORAL_TABLET | Freq: Every day | ORAL | Status: DC
Start: 2015-07-10 — End: 2015-07-10
  Filled 2015-07-10 (×3): qty 1

## 2015-07-10 MED ORDER — BENZTROPINE MESYLATE 1 MG PO TABS: 1.0000 mg | ORAL_TABLET | Freq: Every evening | ORAL | Status: DC | PRN

## 2015-07-10 MED ORDER — ARIPIPRAZOLE 30 MG PO TABS: 30.0000 mg | ORAL_TABLET | Freq: Every day | ORAL | Status: AC

## 2015-07-10 MED ORDER — LAMOTRIGINE 25 MG PO TABS: 25.0000 mg | ORAL_TABLET | Freq: Every day | ORAL | Status: AC

## 2015-07-10 NOTE — Progress Notes (Signed)
  [facility] Adult Case Management Discharge Plan :  Will you be returning to the same living situation after discharge:  Yes,  Pt returning home with family At discharge, do you have transportation home?: Yes,  Pt mother to pick up Do you have the ability to pay for your medications: Yes,  Pt provided with prescriptions  Release of information consent forms completed and in the chart;  Patient's signature needed at discharge.  Patient to Follow up at: Follow-up Information    Follow up with [facility] On 07/12/2015.   Why:  Thursday at 12:15 with Caroll Rancher   Contact information:   [address]  [place] [[phone]] [phone]      Next level of care provider has access to [facility] Link:no  Safety Planning and Suicide Prevention discussed: No. Not needed, but contact was made with mother.  Have you used any form of tobacco in the last 30 days? (Cigarettes, Smokeless Tobacco, Cigars, and/or Pipes): No  Has patient been referred to the Quitline?: N/A patient is not a smoker  Patient has been referred for addiction treatment: N/A  Elaina Hoops 07/10/2015, 9:53 AM

## 2015-07-10 NOTE — Discharge Summary (Signed)
Physician Discharge Summary Note  Patient:  Dorothy Pham is an 30 y.o., female MRN:  [id number] DOB:  [birth year] Patient phone:  [phone] (home)  Patient address:   [address] [place] [address],  Total Time spent with patient: 30 minutes  Date of Admission:  07/02/2015 Date of Discharge: 07/10/2015  Reason for Admission:  Aggressive behavior  Principal Problem: Schizoaffective disorder, bipolar type [facility]) Discharge Diagnoses: Patient Active Problem List   Diagnosis Date Noted  . Schizoaffective disorder, bipolar type (HCC) [F25.0] 07/03/2015  . Spontaneous vaginal delivery [O80] 04/16/2015  . Post term pregnancy, antepartum condition or complication [O48.0] 04/15/2015  . Murmur, cardiac [R01.1] 02/16/2015  . DOE (dyspnea on exertion) [R06.09] 02/16/2015  . Dysplasia of cervix, low grade (CIN 1) [N87.0] 04/21/2012  . Amniotic band syndrome--2013 pregnancy [O99.89] 09/24/2011  . Termination of pregnancy (fetus)--2013 due to anomalies [Z33.2] 09/24/2011  . Obesity [E66.9] 09/24/2011    Past Psychiatric History:  See above noted  Past Medical History:  Past Medical History  Diagnosis Date  . Mental disorder   . Schizoaffective disorder   . Complication of anesthesia   . PONV (postoperative nausea and vomiting)   . Schizoaffective disorder   . Heart murmur   . Obesity   . Vaginal Pap smear, abnormal   . Hx of varicella     Past Surgical History  Procedure Laterality Date  . Breast surgery  2012    reduction  . Colposcopy     Family History:  Family History  Problem Relation Age of Onset  . Anesthesia problems Neg Hx   . Diabetes Father   . Arthritis Father   . Arthritis Mother   . Bipolar disorder Mother   . Diabetes Maternal Grandmother   . Hypertension Maternal Grandmother   . Hypertension Maternal Grandfather   . Diabetes Maternal Grandfather   . Diabetes Paternal Grandmother   . Schizophrenia Paternal Grandmother    Family Psychiatric   History:  See above noted Social History:  History  Alcohol Use No     History  Drug Use No    Social History   Social History  . Marital Status: Single    Spouse Name: N/A  . Number of Children: N/A  . Years of Education: N/A   Social History Main Topics  . Smoking status: Never Smoker   . Smokeless tobacco: Never Used  . Alcohol Use: No  . Drug Use: No  . Sexual Activity: No   Other Topics Concern  . None   Social History Narrative    Hospital Course:  Dorothy Pham is a 30 y.o.female, has a hx of schizoaffective disorder, presented to [facility] after being petitioned by her father for involuntary commitment for aggressive behavior at [place].  Dorothy Pham was admitted for Schizoaffective disorder, bipolar type (HCC) and crisis management.  She was treated with the following medications, Abilify 30 mg po daily for mood sx/psychosis (Abilify Maintena IM 400 mg q 28 days - patient refused), Lithium 300 mg po bid for mood sx, PRN medications as per agitation protocol and Trazodone 150 mg po qhs for sleep.   Dorothy Pham was discharged with current medication and was instructed on how to take medications as prescribed; (details listed below under Medication List).  Medical problems were identified and treated as needed.  Home medications were restarted as appropriate.  Improvement was monitored by observation and Dorothy Pham daily report of symptom reduction.  Emotional and mental status was monitored by  daily self-inventory reports completed by Dorothy Pham and clinical staff.         Dorothy Pham was evaluated by the treatment team for stability and plans for continued recovery upon discharge.  Dorothy Pham motivation was an integral factor for scheduling further treatment.  Employment, transportation, bed availability, health status, family support, and any pending legal issues were also considered during her hospital stay.  She was offered further treatment options upon  discharge including but not limited to Residential, Intensive Outpatient, and Outpatient treatment.  Dorothy Pham will follow up with the services as listed below under Follow Up Information.     Upon completion of this admission the Dorothy Pham was both mentally and medically stable for discharge denying suicidal/homicidal ideation, auditory/visual/tactile hallucinations, delusional thoughts and paranoia.     Physical Findings: AIMS: Facial and Oral Movements Muscles of Facial Expression: None, normal Lips and Perioral Area: None, normal Jaw: None, normal Tongue: None, normal,Extremity Movements Upper (arms, wrists, hands, fingers): None, normal Lower (legs, knees, ankles, toes): None, normal, Trunk Movements Neck, shoulders, hips: None, normal, Overall Severity Severity of abnormal movements (highest score from questions above): None, normal Incapacitation due to abnormal movements: None, normal Patient's awareness of abnormal movements (rate only patient's report): No Awareness, Dental Status Current problems with teeth and/or dentures?: No Does patient usually wear dentures?: No  CIWA:  CIWA-Ar Total: 1 COWS:  COWS Total Score: 2  Musculoskeletal: Strength & Muscle Tone: within normal limits Gait & Station: normal Patient leans: N/A  Psychiatric Specialty Exam:  SEE MD SRA Review of Systems  Psychiatric/Behavioral: Negative for depression, suicidal ideas, hallucinations and substance abuse.  All other systems reviewed and are negative.   Blood pressure 123/73, pulse 96, temperature 97.2 F (36.2 C), temperature source Oral, resp. rate 18, height  (1.702 m), weight 93.441 kg (206 lb), unknown if currently breastfeeding.Body mass index is 32.26 kg/(m^2).  Have you used any form of tobacco in the last 30 days? (Cigarettes, Smokeless Tobacco, Cigars, and/or Pipes): No  Has this patient used any form of tobacco in the last 30 days? (Cigarettes, Smokeless Tobacco, Cigars,  and/or Pipes) Yes, NA  Blood Alcohol level:  Lab Results  Component Value Date   ETH <5 07/01/2015    Metabolic Disorder Labs:  Lab Results  Component Value Date   HGBA1C 5.6 07/05/2015   MPG 114 07/05/2015   No results found for: PROLACTIN Lab Results  Component Value Date   CHOL 173 07/05/2015   TRIG 107 07/05/2015   HDL 46 07/05/2015   CHOLHDL 3.8 07/05/2015   VLDL 21 07/05/2015   LDLCALC 106* 07/05/2015    See Psychiatric Specialty Exam and Suicide Risk Assessment completed by Attending Physician prior to discharge.  Discharge destination:  Home  Is patient on multiple antipsychotic therapies at discharge:  No   Has Patient had three or more failed trials of antipsychotic monotherapy by history:  No  Recommended Plan for Multiple Antipsychotic Therapies: NA     Medication List    STOP taking these medications        diphenhydrAMINE 12.5 MG/5ML elixir  Commonly known as:  BENADRYL     ferrous sulfate 325 (65 FE) MG tablet     ibuprofen 800 MG tablet  Commonly known as:  ADVIL,MOTRIN      TAKE these medications      Indication   ARIPiprazole 30 MG tablet  Commonly known as:  ABILIFY  Take 1 tablet (30 mg total) by mouth  at bedtime.   Indication:  mood stabilization     hydrOXYzine 25 MG tablet  Commonly known as:  ATARAX/VISTARIL  Take 1 tablet (25 mg total) by mouth 3 (three) times daily as needed for anxiety.   Indication:  Anxiety Neurosis     lamoTRIgine 25 MG tablet  Commonly known as:  LAMICTAL  Take 1 tablet (25 mg total) by mouth daily.   Indication:  mood stabilization     traZODone 150 MG tablet  Commonly known as:  DESYREL  Take 1 tablet (150 mg total) by mouth at bedtime.   Indication:  Trouble Sleeping, Major Depressive Disorder       Follow-up Information    Follow up with [facility] On 07/12/2015.   Why:  Thursday at 12:15 with Caroll Rancher   Contact information:   [address]  [place] [[phone]] [phone]       Follow-up recommendations:  Activity:  as tol Diet:  as tol  Comments:  1.  Take all your medications as prescribed.              2.  Report any adverse side effects to outpatient provider.                       3.  Patient instructed to not use alcohol or illegal drugs while on prescription medicines.            4.  In the event of worsening symptoms, instructed patient to call 911, the crisis hotline or go to nearest emergency room for evaluation of symptoms.  Signed: Lindwood Qua, NP [facility] 07/10/2015, 12:57 PM

## 2015-07-10 NOTE — Progress Notes (Signed)
Patient discharged home with prescriptions. Patient was stable and appreciative at that time. All papers and prescriptions were given and valuables returned. Verbal understanding expressed. Denies SI/HI and A/VH. Patient given opportunity to express concerns and ask questions.  

## 2015-07-10 NOTE — Tx Team (Addendum)
Interdisciplinary Treatment Plan Update (Adult)  Date:  07/10/2015   Time Reviewed:  9:51 AM   Progress in Treatment: Attending groups: Yes. Participating in groups:  Yes. Taking medication as prescribed:  Yes. Tolerating medication:  Yes. Family/Significant other contact made:  Yes, with mother Patient understands diagnosis:  No  Limited insight Discussing patient identified problems/goals with staff:  Yes, see initial care plan. Medical problems stabilized or resolved:  Yes. Denies suicidal/homicidal ideation: Yes. Issues/concerns per patient self-inventory:  No. Other:  New problem(s) identified:  Discharge Plan or Barriers: see below  Reason for Continuation of Hospitalization: Hallucinations Medication stabilization Other; describe Paranoia, Disorganization  Comments: Dorothy Pham is a 30 y.o. AA female, who is single , lives in [place] by self , has a 61 month old baby , who is currently with her mother , is unemployed , has a hx of schizoaffective do , who Presented to [facility] after being petitioned by her father for involuntary commitment for aggressive behavior at [facility].Pt today continues to be observed as guarded, paranoid as well as having sleep issues. Will continue treatment.  Daily contact with patient to assess and evaluate symptoms and progress in treatment and Medication management  Will increase Abilify to 25 mg po daily for mood sx/psychosis. Will offer Abilify Maintena IM 400 mg q 28 days - if she agrees. Will add PRN medications as per agitation protocol. Will increase Trazodone to 150 mg po qhs for sleep. No medication changed as of meeting on 2/27. Lamictal added day of discharge, 16m Cogentin changed to as needed   Estimated length of stay: 0 days  New goal(s):  Review of initial/current patient goals per problem list:   Review of initial/current patient goals per problem list:  1. Goal(s): Patient will participate in aftercare plan   Met:  Yes   Target date: 3-5 days post admission date   As evidenced by: Patient will participate within aftercare plan AEB aftercare provider and housing plan at discharge being identified. 07/04/15:  Plans to return home, follow up outpt   5. Goal(s): Patient will demonstrate decreased signs of psychosis  * Met: Yes  * Target date: 3-5 days post admission date  * As evidenced by: Patient will demonstrate decreased frequency of AVH or return to baseline function 07/04/15:  Pt was not taking meds for the past year.  Presents with symptoms of paranoia, disorganization, VH. 07/09/15: Pt continues to be somewhat guarded, but denies AVH at this time. Pt continues to be intrusive at times but is redirectable.  07/10/15: Pt mood has improved and she continues to deny AVH. Pt thought process has improved, less disorganized.      Attendees: Patient:  07/10/2015 9:51 AM   Family:   07/10/2015 9:51 AM   Physician:  SUrsula Alert MD 07/10/2015 9:51 AM   Nursing:   HManuella Ghazi RN; ELarrabee RN 07/10/2015 9:51 AM   CSW:    LPeri Maris LCSW   07/10/2015 9:51 AM   Other:  07/10/2015 9:51 AM   Other:   07/10/2015 9:51 AM   Other:   07/10/2015 9:51 AM   Other:   07/10/2015 9:51 AM   Other:   07/10/2015 9:51 AM   Other:  07/10/2015 9:51 AM   Other:  07/10/2015 9:51 AM   Other:  07/10/2015 9:51 AM   Other:  07/10/2015 9:51 AM   Other:  07/10/2015 9:51 AM   Other:   07/10/2015 9:51 AM    Scribe for Treatment Team:  Peri Maris, [place] [employer] [phone]

## 2015-07-10 NOTE — [facility] Group Notes (Signed)
[facility] Group Notes:  (Nursing/MHT/Case Management/Adjunct)  Date:  07/10/2015  Time:  9:30am  Type of Therapy:  Nurse Education  Participation Level:  Minimal  Participation Quality:  Drowsy  Affect:  Flat  Cognitive:  Drowsy  Insight:  Limited  Engagement in Group:  Slept during group  Modes of Intervention:  Discussion and Education  Summary of Progress/Problems:  Group topic today was recovery.  Discussed what recovery means to the group.  Discussed long term and short term goals.  Reviewed sleep hygiene.  Dorothy Pham did not participate in the group conversation.

## 2015-07-10 NOTE — [facility] Suicide Risk Assessment (Signed)
[facility] Discharge Suicide Risk Assessment   Principal Problem: Schizoaffective disorder, bipolar type [facility]) Discharge Diagnoses:  Patient Active Problem List   Diagnosis Date Noted  . Schizoaffective disorder, bipolar type (HCC) [F25.0] 07/03/2015  . Spontaneous vaginal delivery [O80] 04/16/2015  . Post term pregnancy, antepartum condition or complication [O48.0] 04/15/2015  . Murmur, cardiac [R01.1] 02/16/2015  . DOE (dyspnea on exertion) [R06.09] 02/16/2015  . Dysplasia of cervix, low grade (CIN 1) [N87.0] 04/21/2012  . Amniotic band syndrome--2013 pregnancy [O99.89] 09/24/2011  . Termination of pregnancy (fetus)--2013 due to anomalies [Z33.2] 09/24/2011  . Obesity [E66.9] 09/24/2011    Total Time spent with patient: 30 minutes  Musculoskeletal: Strength & Muscle Tone: within normal limits Gait & Station: normal Patient leans: N/A  Psychiatric Specialty Exam: Review of Systems  Psychiatric/Behavioral: Negative for depression, suicidal ideas, hallucinations and substance abuse. The patient is not nervous/anxious.   All other systems reviewed and are negative.   Blood pressure 123/73, pulse 96, temperature 97.2 F (36.2 C), temperature source Oral, resp. rate 18, height  (1.702 m), weight 93.441 kg (206 lb), unknown if currently breastfeeding.Body mass index is 32.26 kg/(m^2).  General Appearance: Casual  Eye Contact::  Fair  Speech:  Normal Rate409  Volume:  Normal  Mood:  Euthymic  Affect:  Congruent  Thought Process:  Goal Directed  Orientation:  Full (Time, Place, and Person)  Thought Content:  WDL  Suicidal Thoughts:  No  Homicidal Thoughts:  No  Memory:  Immediate;   Fair Recent;   Fair Remote;   Fair  Judgement:  Fair  Insight:  Fair  Psychomotor Activity:  Normal  Concentration:  Fair  Recall:  [employer] of Knowledge:Fair  Language: Fair  Akathisia:  No  Handed:  Right  AIMS (if indicated):   0  Assets:  Communication Skills Desire for  Improvement Housing Physical Health Social Support  Sleep:  Number of Hours: 1.75  Cognition: WNL  ADL's:  Intact   Mental Status Per Nursing Assessment::   On Admission:     Demographic Factors:  Unemployed  Loss Factors: NA  Historical Factors: Impulsivity  Risk Reduction Factors:   Responsible for children under 21 years of age, Sense of responsibility to family, Living with another person, especially a relative, Positive social support and Positive therapeutic relationship  Continued Clinical Symptoms:  Previous Psychiatric Diagnoses and Treatments  Cognitive Features That Contribute To Risk:  Polarized thinking    Suicide Risk:  Minimal: No identifiable suicidal ideation.  Patients presenting with no risk factors but with morbid ruminations; may be classified as minimal risk based on the severity of the depressive symptoms  Follow-up Information    Follow up with [facility] On 07/12/2015.   Why:  Thursday at 12:15 with Caroll Rancher   Contact information:   [address]  [place] [[id number]] [phone]      Plan Of Care/Follow-up recommendations:  Activity:  no restrictions Diet:  regular Tests:  as needed Other:  follow up with after care  Hutch Rhett, MD 07/10/2015, 9:17 AM

## 2016-07-10 ENCOUNTER — Ambulatory Visit (HOSPITAL_COMMUNITY)
Admission: RE | Admit: 2016-07-10 | Discharge: 2016-07-10 | Disposition: A | Discharge status: Home / Self Care | Payer: Medicare HMO | Source: Ambulatory Visit | Attending: Internal Medicine | Admitting: Internal Medicine

## 2016-07-10 ENCOUNTER — Other Ambulatory Visit (HOSPITAL_COMMUNITY)
Admission: RE | Admit: 2016-07-10 | Discharge: 2016-07-10 | Disposition: A | Discharge status: Home / Self Care | Payer: Medicare HMO | Source: Other Acute Inpatient Hospital | Attending: Internal Medicine | Admitting: Internal Medicine

## 2016-07-10 ENCOUNTER — Other Ambulatory Visit (HOSPITAL_COMMUNITY): Payer: Self-pay | Admitting: Internal Medicine

## 2016-07-10 DIAGNOSIS — D649 Anemia, unspecified: Secondary | ICD-10-CM | POA: Insufficient documentation

## 2016-07-10 DIAGNOSIS — E559 Vitamin D deficiency, unspecified: Secondary | ICD-10-CM | POA: Insufficient documentation

## 2016-07-10 DIAGNOSIS — M545 Low back pain: Secondary | ICD-10-CM | POA: Insufficient documentation

## 2016-07-10 DIAGNOSIS — M79672 Pain in left foot: Secondary | ICD-10-CM

## 2016-07-10 DIAGNOSIS — M25572 Pain in left ankle and joints of left foot: Secondary | ICD-10-CM | POA: Diagnosis present

## 2016-07-10 DIAGNOSIS — Z79899 Other long term (current) drug therapy: Secondary | ICD-10-CM | POA: Insufficient documentation

## 2016-07-10 LAB — COMPREHENSIVE METABOLIC PANEL
ALT: 16 U/L (ref 14–54)
ANION GAP: 6 (ref 5–15)
AST: 18 U/L (ref 15–41)
Albumin: 4.3 g/dL (ref 3.5–5.0)
Alkaline Phosphatase: 59 U/L (ref 38–126)
BUN: 16 mg/dL (ref 6–20)
CHLORIDE: 107 mmol/L (ref 101–111)
CO2: 26 mmol/L (ref 22–32)
CREATININE: 0.87 mg/dL (ref 0.44–1.00)
Calcium: 10 mg/dL (ref 8.9–10.3)
Glucose, Bld: 81 mg/dL (ref 65–99)
Potassium: 4.8 mmol/L (ref 3.5–5.1)
SODIUM: 139 mmol/L (ref 135–145)
Total Bilirubin: 0.7 mg/dL (ref 0.3–1.2)
Total Protein: 8 g/dL (ref 6.5–8.1)

## 2016-12-12 IMAGING — US US MFM UA CORD DOPPLER
1 series · 12 of 13 positions shown · non-contrast
Comparison: none

[Series 1: us mfm ua cord doppler · 13 acquisitions, 12 frames shown]
[im 1/13]
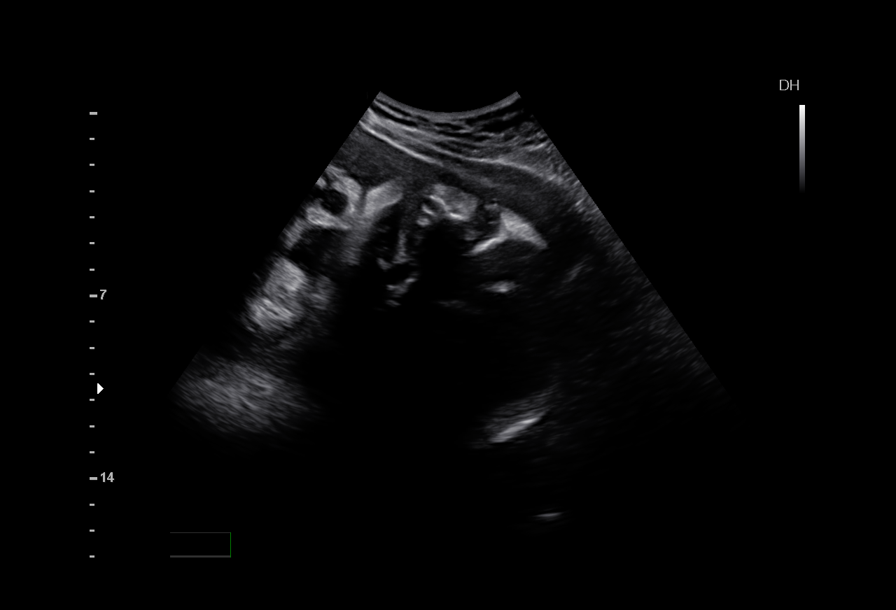
[im 2/13]
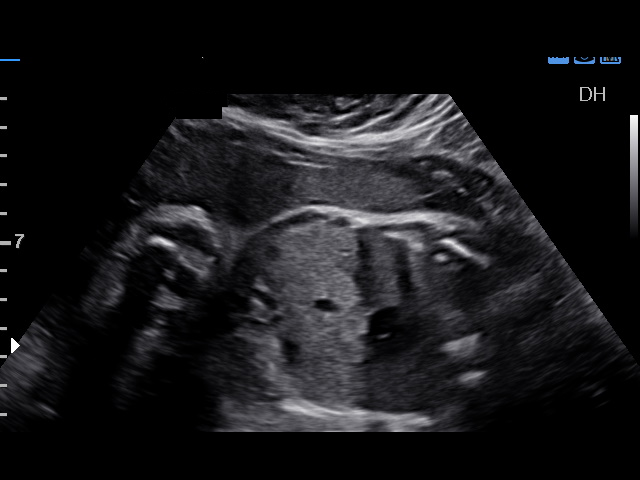
[im 3/13]
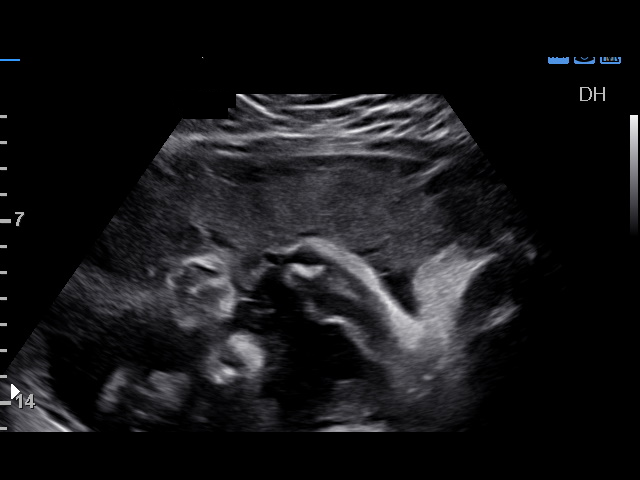
[im 4/13]
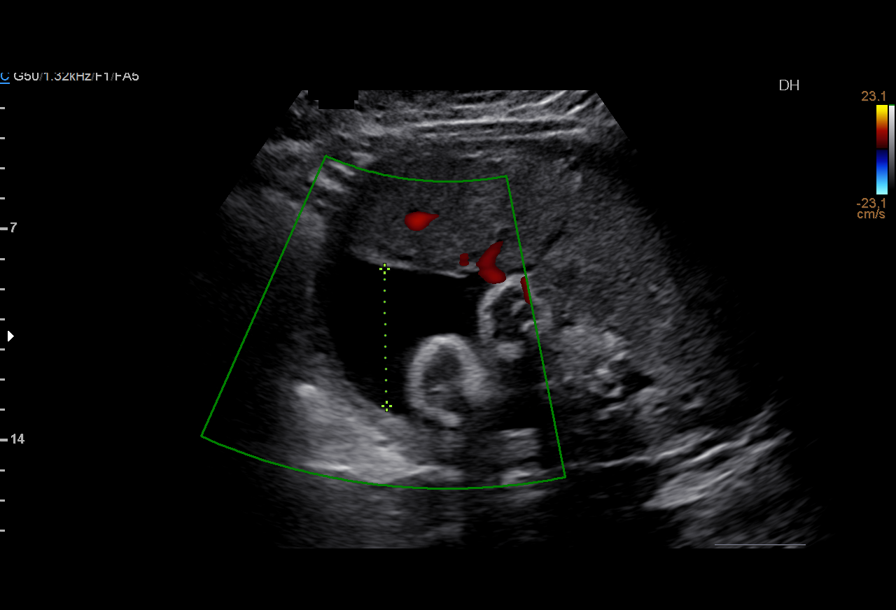
[im 5/13]
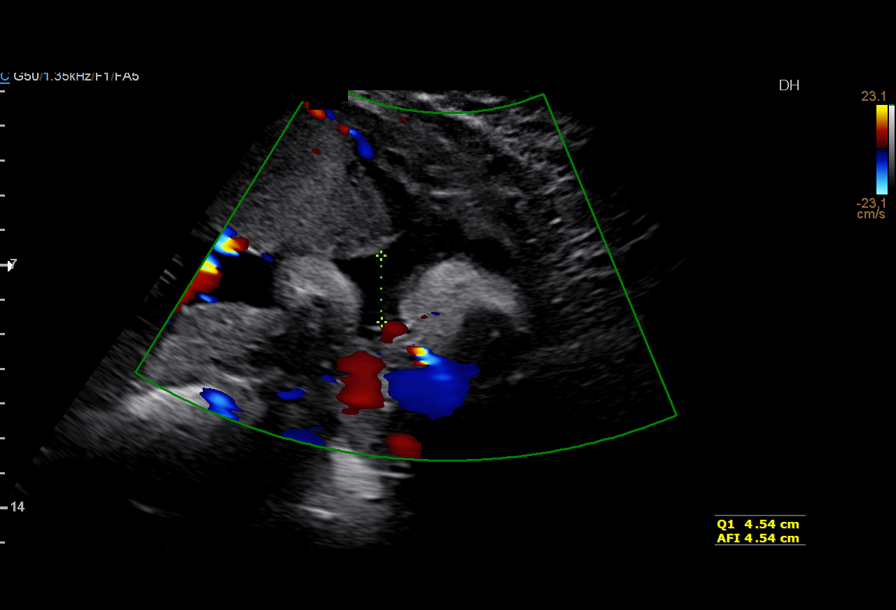
[im 6/13]
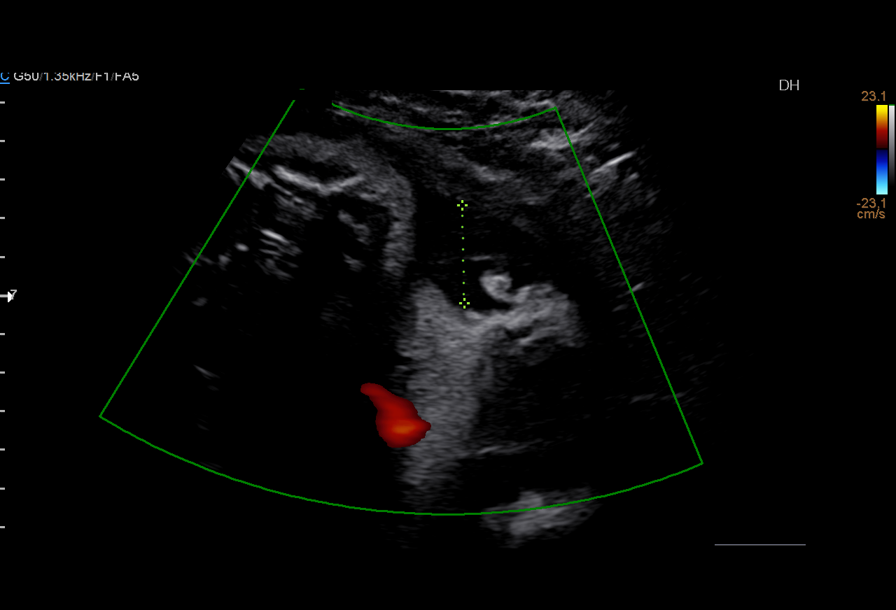
[im 8/13]
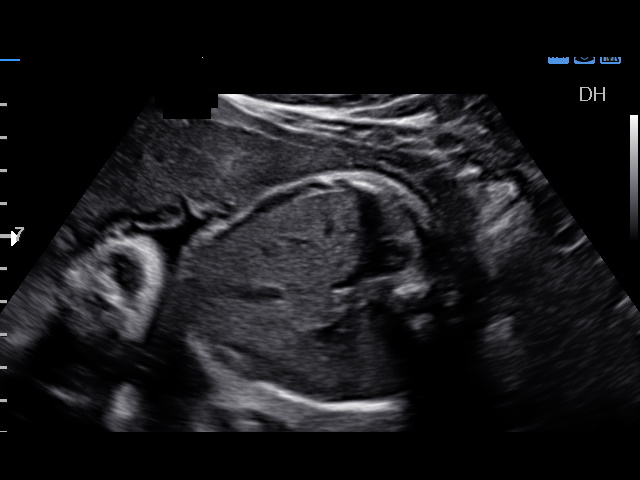
[im 9/13]
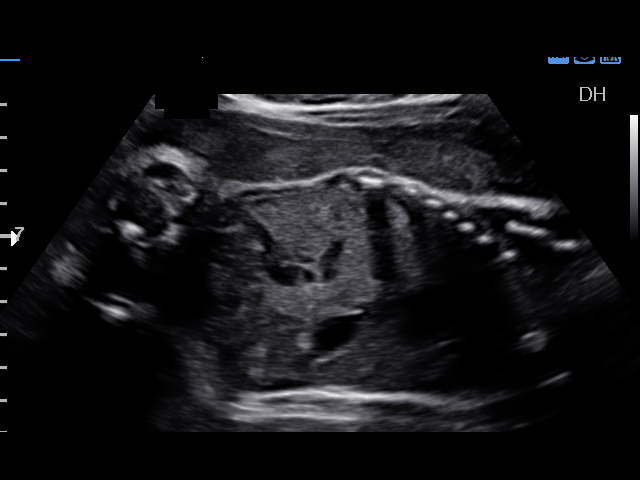
[im 10/13]
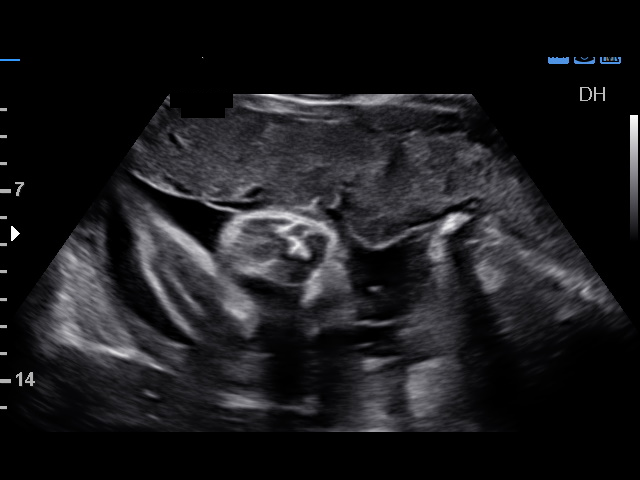
[im 11/13]
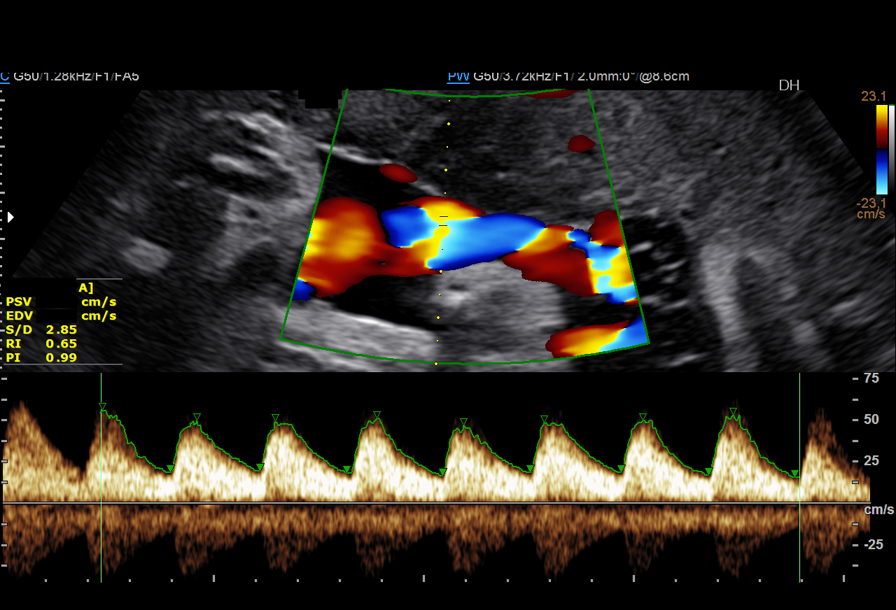
[im 12/13]
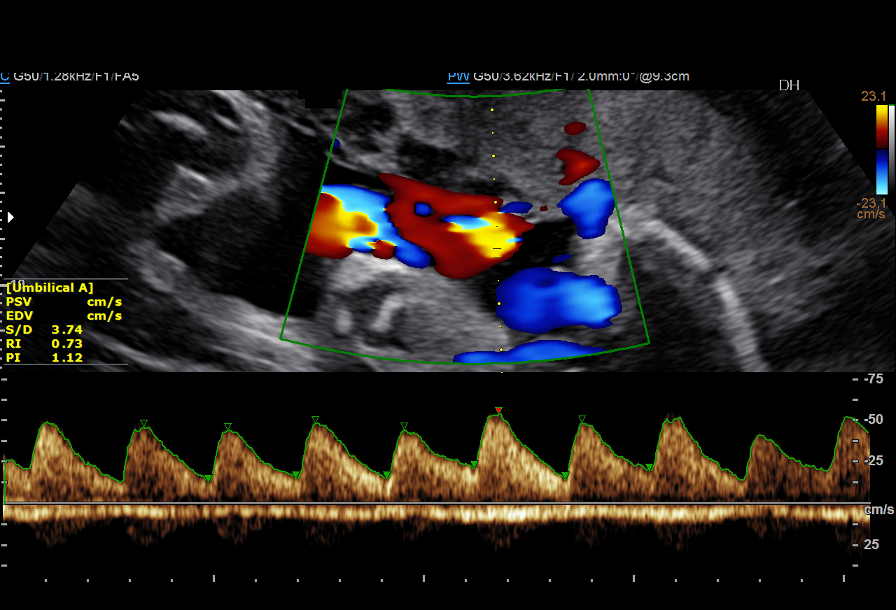
[im 13/13]
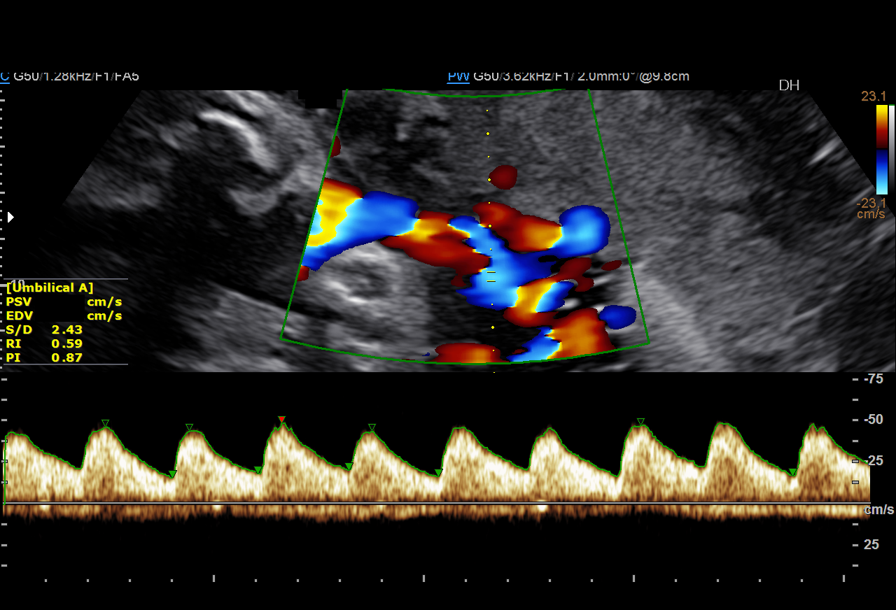

[12 of 13 positions shown; findings below may reference images not displayed]

OBSTETRICS REPORT
(Signed Final 03/02/2015 [DATE])

Name:       NOMASIBULELE MOATSHE                         Visit  02/28/2015 [DATE]
Date:

Service(s) Provided

US MFM UA CORD DOPPLER                                 76820.02
Indications

32 weeks gestation of pregnancy
Small for gestational age fetus affecting
management of mother
History of fetal abnormality in previous
pregnancy, currently pregnant (1q21 deletion,
absent left radius, abnormal left hand, bilateral
clubbed feet, membranes entangling lower
extremities and cord, IOL at 19 weeks)
Other mental disorder complicating pregnancy,
third trimester (schizoaffective disorder)
Obesity complicating pregnancy, third trimester
Fetal Evaluation

Num Of             1
Fetuses:
Fetal Heart        133                          bpm
Rate:
Cardiac Activity:  Observed
Presentation:      Cephalic
Placenta:          Anterior, above cervical
os
P. Cord            Previously Visualized
Insertion:

Amniotic Fluid
AFI FV:      Subjectively within normal limits
AFI Sum:     12.25    cm      34  %Tile     Larg Pckt:    4.54   cm
RUQ:   4.54    cm    RLQ:   1.91    cm   LUQ:    2.53    cm   LLQ:    3.27   cm
Biophysical Evaluation

Amniotic F.V:   Pocket => 2 cm two          F. Tone:        Observed
planes
F. Movement:    Observed                    Score:          [DATE]
F. Breathing:   Observed
Gestational Age

LMP:           32w 3d        Date:  07/16/14                  EDD:   04/22/15
Best:          32w 3d    Det. By:   LMP  (07/16/14)           EDD:   04/22/15
Doppler - Fetal Vessels

Umbilical Artery
S/D:   2.93           63   %tile
Umbilical Artery
Absent DFV:     No    Reverse         No
DFV:

Impression

SIUP at 32+3 weeks
Normal amniotic fluid volume
BPP [DATE]
UA dopplers were normal for this GA
Recommendations

Continue weekly BPPs and UA Dopplers
Growth US in 2-3 weeks

## 2017-01-09 IMAGING — US US MFM FETAL BPP W/O NON-STRESS
1 series · 15 of 25 positions shown · non-contrast
Comparison: none

[Series 1: us mfm fetal bpp w/o non-stress · 25 acquisitions, 15 frames shown]
[im 1/25]
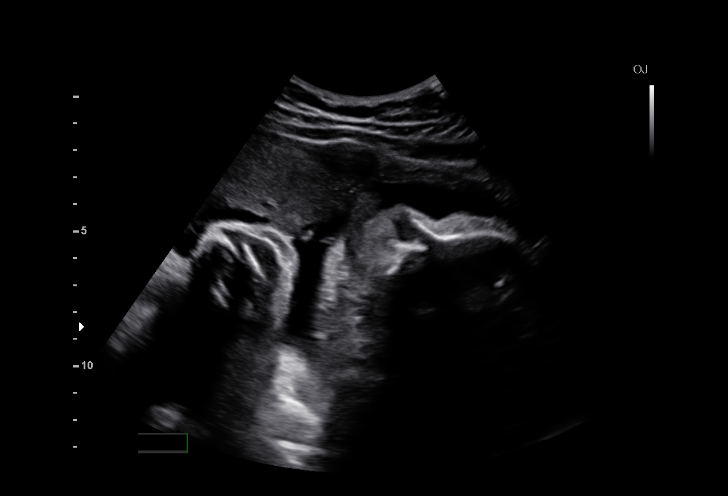
[im 3/25]
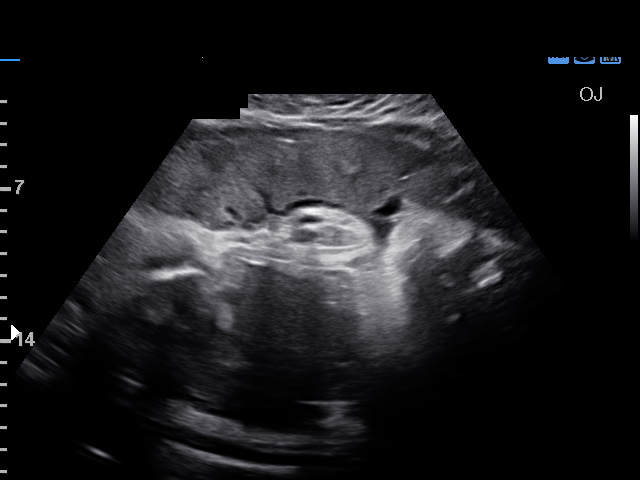
[im 5/25]
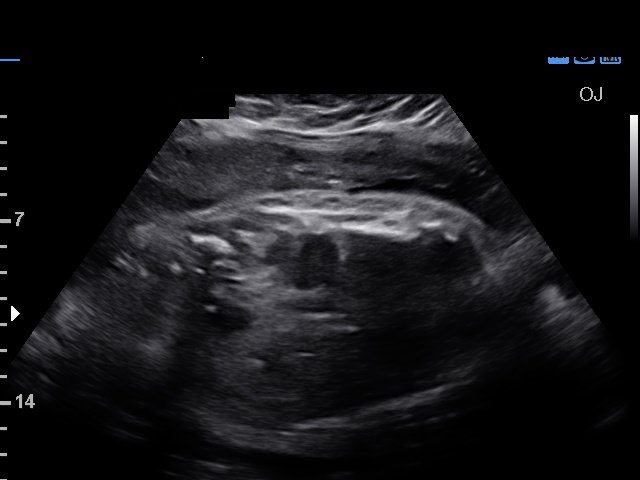
[im 6/25]
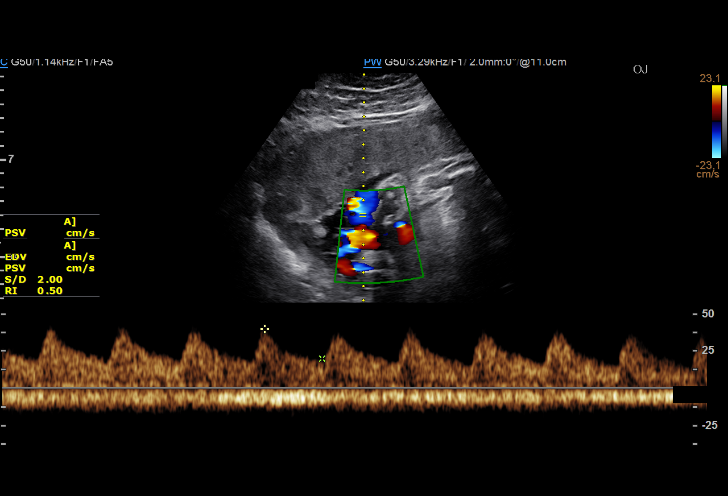
[im 8/25]
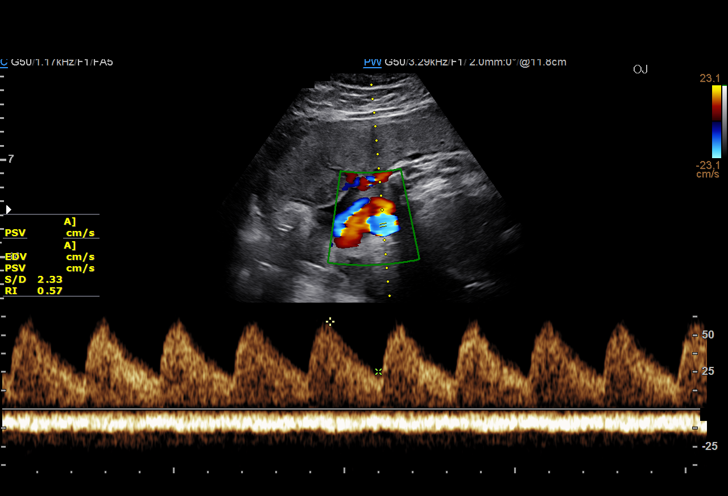
[im 10/25]
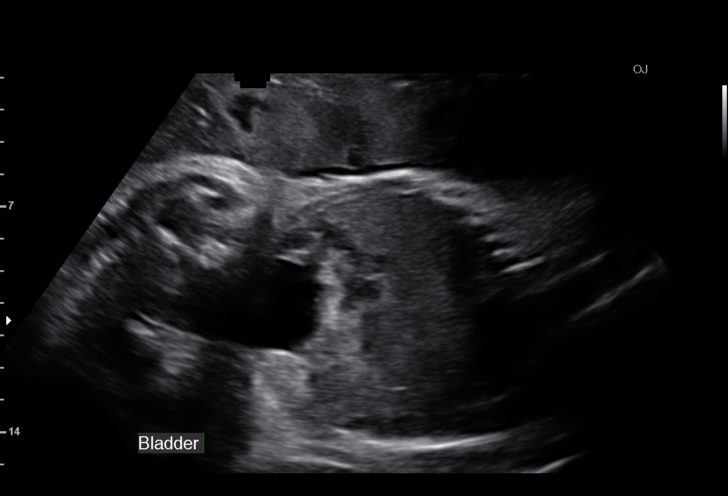
[im 11/25]
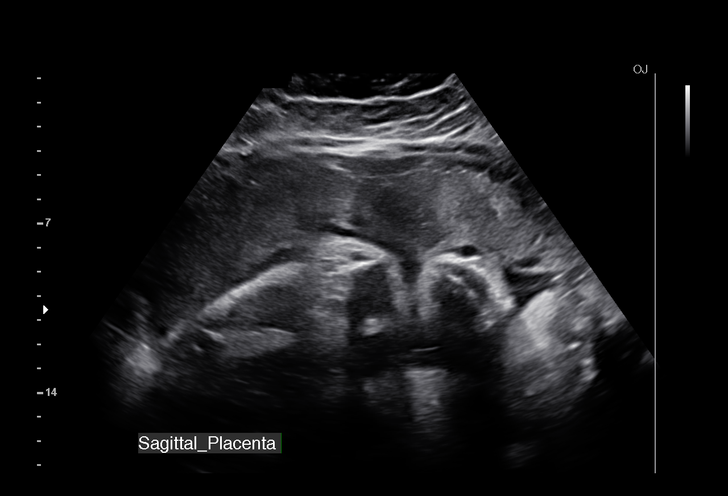
[im 13/25]
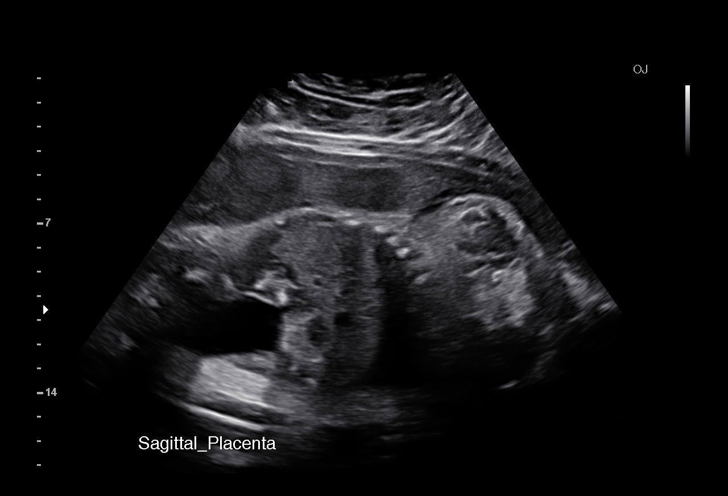
[im 15/25]
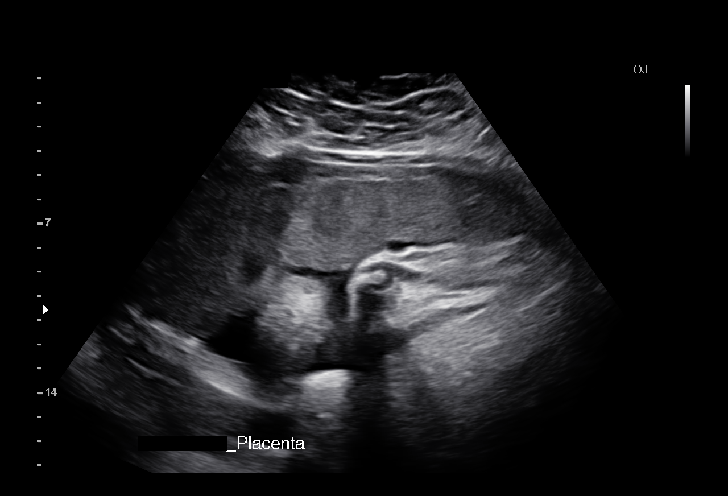
[im 16/25]
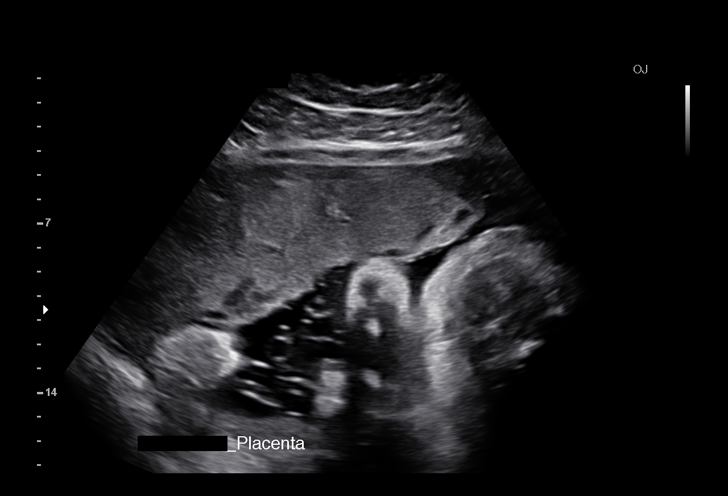
[im 18/25]
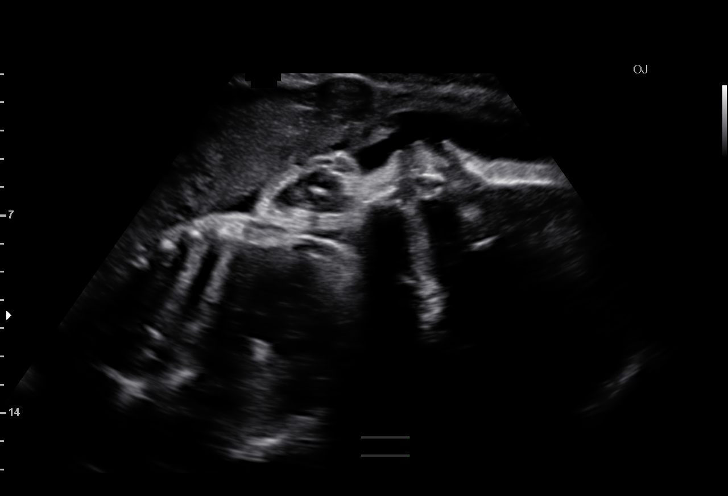
[im 20/25]
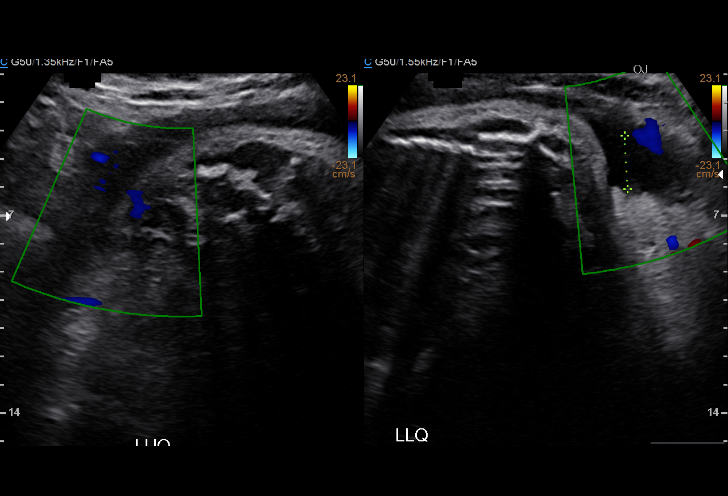
[im 21/25]
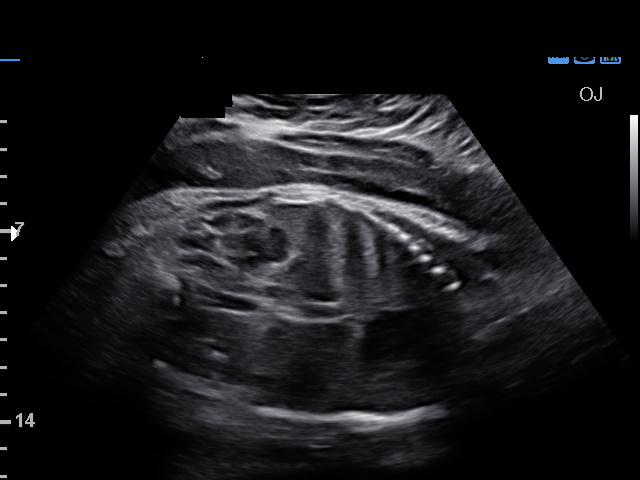
[im 23/25]
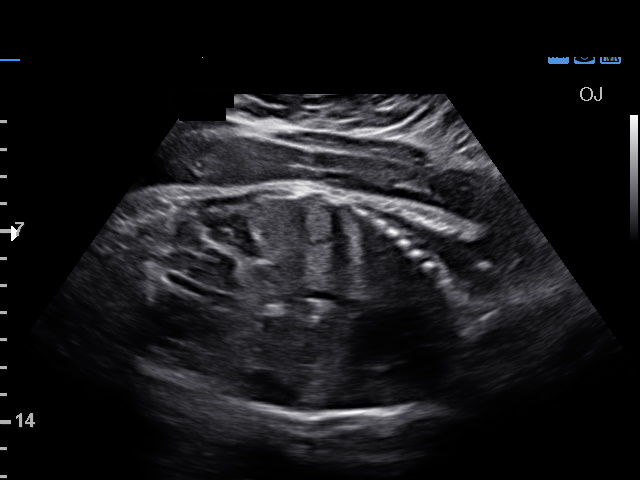
[im 25/25]
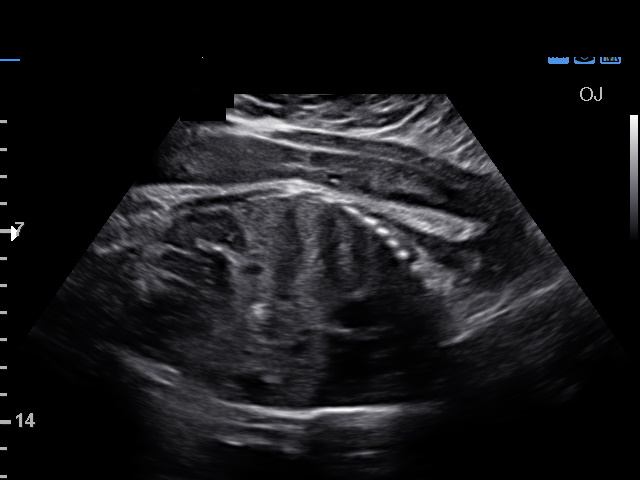

[15 of 25 positions shown; findings below may reference images not displayed]

OBSTETRICS REPORT
(Signed Final 03/31/2015 [DATE])

Service(s) Provided

US MFM UA CORD DOPPLER                                76820.02
Indications

36 weeks gestation of pregnancy
Small for gestational age fetus affecting
management of mother
History of fetal abnormality in previous pregnancy,
currently pregnant (1q21 deletion, absent left
radius, abnormal left hand, bilateral clubbed feet,
membranes entangling lower extremities and cord,
IOL at 19 weeks)
Other mental disorder complicating pregnancy,
third trimester (schizoaffective disorder)
Obesity complicating pregnancy, third trimester
Fetal Evaluation

Num Of Fetuses:    1
Fetal Heart Rate:  132                          bpm
Cardiac Activity:  Observed
Presentation:      Cephalic
Placenta:          Anterior, above cervical os

Amniotic Fluid
AFI FV:      Subjectively low-normal
AFI Sum:     8.07     cm       8  %Tile      Larg Pckt:   3.61  cm
RUQ:   3.61    cm   LUQ:    2.57   cm    LLQ:   1.89    cm
Biophysical Evaluation

Amniotic F.V:   Within normal limits       F. Tone:         Observed
F. Movement:    Observed                   Score:           [DATE]
F. Breathing:   Observed
Gestational Age

LMP:           36w 3d        Date:  07/16/14                 EDD:    04/22/15
Best:          36w 3d     Det. By:  LMP  (07/16/14)          EDD:    04/22/15
Doppler - Fetal Vessels
Umbilical Artery
S/D:   2.37           50   %tile
Umbilical Artery
Absent DFV:     No    Reverse DFV:    No

Impression

SIUP at 36+3 weeks
Low normal amniotic fluid volume
BPP [DATE]
UA dopplers were normal for this GA
Recommendations

BPP, UA dopplers and growth next week
Suggest delivery between 38 and 39 weeks if testing remains
reassuring and growth appropriate

questions or concerns.
